# Patient Record
Sex: Male | Born: 1960 | Race: White | Hispanic: No | Marital: Married | Smoking: Current every day smoker
Health system: Southern US, Community
[De-identification: ages and names within clinical notes are randomized; demographics above are authoritative.]

## PROBLEM LIST (undated history)

## (undated) DIAGNOSIS — K649 Unspecified hemorrhoids: Secondary | ICD-10-CM

## (undated) HISTORY — DX: Unspecified hemorrhoids: K64.9

---

## 1999-04-14 ENCOUNTER — Ambulatory Visit (HOSPITAL_COMMUNITY): Admission: RE | Admit: 1999-04-14 | Discharge: 1999-04-14 | Payer: Self-pay

## 2001-12-09 ENCOUNTER — Ambulatory Visit (HOSPITAL_COMMUNITY): Admission: RE | Admit: 2001-12-09 | Discharge: 2001-12-09 | Payer: Self-pay | Admitting: General Surgery

## 2002-06-30 ENCOUNTER — Encounter: Payer: Self-pay | Admitting: General Surgery

## 2002-06-30 ENCOUNTER — Ambulatory Visit (HOSPITAL_COMMUNITY): Admission: RE | Admit: 2002-06-30 | Discharge: 2002-06-30 | Payer: Self-pay | Admitting: General Surgery

## 2002-07-06 ENCOUNTER — Encounter: Payer: Self-pay | Admitting: General Surgery

## 2002-07-06 ENCOUNTER — Ambulatory Visit (HOSPITAL_COMMUNITY): Admission: RE | Admit: 2002-07-06 | Discharge: 2002-07-06 | Payer: Self-pay | Admitting: General Surgery

## 2003-02-15 ENCOUNTER — Ambulatory Visit (HOSPITAL_COMMUNITY): Admission: RE | Admit: 2003-02-15 | Discharge: 2003-02-15 | Payer: Self-pay | Admitting: General Surgery

## 2004-12-21 HISTORY — PX: OTHER SURGICAL HISTORY: SHX169

## 2013-07-10 ENCOUNTER — Encounter: Payer: Self-pay | Admitting: Nurse Practitioner

## 2013-07-10 ENCOUNTER — Ambulatory Visit (INDEPENDENT_AMBULATORY_CARE_PROVIDER_SITE_OTHER): Payer: PRIVATE HEALTH INSURANCE | Admitting: Nurse Practitioner

## 2013-07-10 VITALS — BP 154/94 | Wt 212.0 lb

## 2013-07-10 DIAGNOSIS — M5430 Sciatica, unspecified side: Secondary | ICD-10-CM

## 2013-07-10 DIAGNOSIS — M5432 Sciatica, left side: Secondary | ICD-10-CM

## 2013-07-10 MED ORDER — NAPROXEN 500 MG PO TABS: 500.0000 mg | ORAL_TABLET | Freq: Two times a day (BID) | ORAL | Status: DC

## 2013-07-10 MED ORDER — HYDROCODONE-ACETAMINOPHEN 5-325 MG PO TABS: 1.0000 | ORAL_TABLET | ORAL | Status: DC | PRN

## 2013-07-10 MED ORDER — AMITRIPTYLINE HCL 10 MG PO TABS: 10.0000 mg | ORAL_TABLET | Freq: Every day | ORAL | Status: DC

## 2013-07-11 DIAGNOSIS — M5432 Sciatica, left side: Secondary | ICD-10-CM | POA: Insufficient documentation

## 2013-07-11 NOTE — Assessment & Plan Note (Signed)
Plan: Meds ordered this encounter  Medications  . HYDROcodone-acetaminophen (NORCO/VICODIN) 5-325 MG per tablet    Sig: Take 1 tablet by mouth every 4 (four) hours as needed for pain.    Dispense:  30 tablet    Refill:  0    Order Specific Question:  Supervising Provider    Answer:  Merlyn Albert [[id number]]  . naproxen (NAPROSYN) 500 MG tablet    Sig: Take 1 tablet (500 mg total) by mouth 2 (two) times daily with a meal. Prn pain    Dispense:  30 tablet    Refill:  0    Order Specific Question:  Supervising Provider    Answer:  Merlyn Albert [[id number]]  . amitriptyline (ELAVIL) 10 MG tablet    Sig: Take 1 tablet (10 mg total) by mouth at bedtime. For nerve pain    Dispense:  30 tablet    Refill:  2    Order Specific Question:  Supervising Provider    Answer:  Merlyn Albert [[id number]]   Hydrocodone for severe pain, use sparingly, drowsiness precautions. Because pain is are gone for 3 weeks, recommend a trial of Elavil 10 mg each bedtime to see if this will also help with pain. Call back in 2-3 weeks if no improvement, sooner if worse. Continue other measures.

## 2013-07-11 NOTE — Progress Notes (Signed)
Subjective:  Presents for complaints of left-sided sciatica. Has been seeing Dr. Magnus Sinning in [place] which has helped. He suggested seeing his primary care provider for anti-inflammatories and other medications to help with the pain. Began about 3 weeks ago. No specific history of injury. 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 Pain does not seem to bother him as much when he's walking. Pain localized to the low back left buttock area. It first radiate down the back of his leg to the knee but this has resolved. Using a TENS unit and ice/heat applications.  Objective:   BP 154/94  Wt 212 lb (96.163 kg) NAD. Alert, oriented. Lungs clear. Heart regular rate rhythm. Minimal tenderness in the left buttock area near the sciatic notch. SLR negative on the right, positive on the left. Reflexes normal limit lower extremities. Gait normal limit.  Assessment:Sciatica of left side  Plan: Meds ordered this encounter  Medications  . HYDROcodone-acetaminophen (NORCO/VICODIN) 5-325 MG per tablet    Sig: Take 1 tablet by mouth every 4 (four) hours as needed for pain.    Dispense:  30 tablet    Refill:  0    Order Specific Question:  Supervising Provider    Answer:  Merlyn Albert [[id number]]  . naproxen (NAPROSYN) 500 MG tablet    Sig: Take 1 tablet (500 mg total) by mouth 2 (two) times daily with a meal. Prn pain    Dispense:  30 tablet    Refill:  0    Order Specific Question:  Supervising Provider    Answer:  Merlyn Albert [[id number]]  . amitriptyline (ELAVIL) 10 MG tablet    Sig: Take 1 tablet (10 mg total) by mouth at bedtime. For nerve pain    Dispense:  30 tablet    Refill:  2    Order Specific Question:  Supervising Provider    Answer:  Merlyn Albert [[id number]]   Hydrocodone for severe pain, use sparingly, drowsiness precautions. Because pain is are gone for 3 weeks, recommend a trial of Elavil 10 mg each bedtime to see if this will also help with pain. Call back  in 2-3 weeks if no improvement, sooner if worse. Continue other measures.

## 2013-07-17 ENCOUNTER — Telehealth: Payer: Self-pay | Admitting: Family Medicine

## 2013-07-17 NOTE — Telephone Encounter (Signed)
This was a late message on Monday; since I am off Tuesday, please call patient to see if problem is urgent.  Thanks

## 2013-07-17 NOTE — Telephone Encounter (Signed)
Patient would like for you to call him regarding his past visit

## 2013-07-18 NOTE — Telephone Encounter (Signed)
Left message with male to have patient return call  

## 2013-07-18 NOTE — Telephone Encounter (Signed)
Patient wants you to call him tomm about his meds.

## 2013-07-21 ENCOUNTER — Telehealth: Payer: Self-pay | Admitting: *Deleted

## 2013-07-21 MED ORDER — ETODOLAC 300 MG PO CAPS: 300.0000 mg | ORAL_CAPSULE | Freq: Three times a day (TID) | ORAL | Status: DC

## 2013-07-21 NOTE — Addendum Note (Signed)
Addended by: Margaretha Sheffield on: 07/21/2013 03:59 PM   Modules accepted: Orders

## 2013-07-21 NOTE — Telephone Encounter (Signed)
Saw carolyn last week. Prescribed pain med for sciatica pain but wanted to know if he could also get an anti inflammatory med. [facility] [place]

## 2013-07-21 NOTE — Telephone Encounter (Signed)
Rx sent electronically to [facility]. Patient notified and advised to stop the naproxen.

## 2013-07-21 NOTE — Telephone Encounter (Signed)
Patient may use the etodolac 300 mg 1 3 times a day #60. One refill. Followup if ongoing troubles. Do not take other anti-inflammatories with this such as Naprosyn/Advil/Aleve

## 2013-07-22 NOTE — Telephone Encounter (Signed)
Patient left another message Friday and Dr. Lorin Picket changed his anti inflammatory med .

## 2013-07-27 ENCOUNTER — Telehealth: Payer: Self-pay | Admitting: Family Medicine

## 2013-07-27 ENCOUNTER — Other Ambulatory Visit: Payer: Self-pay | Admitting: *Deleted

## 2013-07-27 MED ORDER — PREDNISONE 20 MG PO TABS: ORAL_TABLET | ORAL | Status: DC

## 2013-07-27 NOTE — Telephone Encounter (Signed)
Patient called today asking if Dr Magnus Sinning had sent over the paperwork asking you to put patient on prednisone?     [facility]

## 2013-07-27 NOTE — Telephone Encounter (Signed)
So far haven't heard, but prednisone can be helpful for this,it is a short term med, if ongoing trouble follow up. Prednisone 20 mg , 3qd for 3d then 2qd for 3d then 1qd for 3d, #18. Minimize sugar and starches while on medicine!!

## 2013-07-27 NOTE — Telephone Encounter (Signed)
Discussed with pt. Med sent to [facility] [place].

## 2013-08-02 ENCOUNTER — Telehealth: Payer: Self-pay | Admitting: Family Medicine

## 2013-08-02 MED ORDER — PREDNISONE 20 MG PO TABS: ORAL_TABLET | ORAL | Status: DC

## 2013-08-02 NOTE — Telephone Encounter (Signed)
Patient would like a refill on predniSONE (DELTASONE) 20 MG tablet.  States this really helps his back feel much better.    [facility] in [place]  Please call Patient. Thanks

## 2013-08-02 NOTE — Telephone Encounter (Signed)
Ok may ref Pred 20 numb 18 3 qd for 3 d two qd for 3 d one qd for2d

## 2013-08-02 NOTE — Telephone Encounter (Signed)
Sent in Pred 20 numb 18 3 qd for 3 d two qd for 3 d one qd for 2d to [facility]. Left message on voicemail notifying patient.

## 2013-10-17 ENCOUNTER — Emergency Department (HOSPITAL_COMMUNITY): Admission: EM | Admit: 2013-10-17 | Discharge: 2013-10-17 | Discharge status: Against Medical Advice | Payer: Self-pay

## 2013-10-27 ENCOUNTER — Encounter (HOSPITAL_COMMUNITY): Payer: Self-pay

## 2013-10-27 ENCOUNTER — Encounter (HOSPITAL_COMMUNITY)
Admission: RE | Admit: 2013-10-27 | Discharge: 2013-10-27 | Disposition: A | Discharge status: Home / Self Care | Payer: PRIVATE HEALTH INSURANCE | Source: Ambulatory Visit | Attending: Family Medicine | Admitting: Family Medicine

## 2013-10-27 DIAGNOSIS — Z23 Encounter for immunization: Secondary | ICD-10-CM | POA: Insufficient documentation

## 2013-10-27 MED ORDER — RABIES IMMUNE GLOBULIN 150 UNIT/ML IM INJ
20.0000 [IU]/kg | INJECTION | Freq: Once | INTRAMUSCULAR | Status: AC
  Administered 2013-10-27: 2000 [IU] via INTRAMUSCULAR

## 2013-10-27 NOTE — Progress Notes (Signed)
Patient tolerated well. Instructed to watch sites for swelling redness or drainage.

## 2016-04-22 ENCOUNTER — Ambulatory Visit (INDEPENDENT_AMBULATORY_CARE_PROVIDER_SITE_OTHER): Payer: PRIVATE HEALTH INSURANCE | Admitting: Nurse Practitioner

## 2016-04-22 ENCOUNTER — Encounter: Payer: Self-pay | Admitting: Nurse Practitioner

## 2016-04-22 VITALS — BP 122/88 | Temp 99.0°F | Ht 68.0 in | Wt 203.0 lb

## 2016-04-22 DIAGNOSIS — J329 Chronic sinusitis, unspecified: Secondary | ICD-10-CM | POA: Diagnosis not present

## 2016-04-22 DIAGNOSIS — J310 Chronic rhinitis: Secondary | ICD-10-CM

## 2016-04-22 MED ORDER — VARENICLINE TARTRATE 0.5 MG X 11 & 1 MG X 42 PO MISC: ORAL | Status: DC

## 2016-04-22 MED ORDER — AZITHROMYCIN 250 MG PO TABS: ORAL_TABLET | ORAL | Status: DC

## 2016-04-22 MED ORDER — VARENICLINE TARTRATE 1 MG PO TABS: 1.0000 mg | ORAL_TABLET | Freq: Two times a day (BID) | ORAL | Status: DC

## 2016-04-22 MED ORDER — HYDROCODONE-HOMATROPINE 5-1.5 MG/5ML PO SYRP: 5.0000 mL | ORAL_SOLUTION | ORAL | Status: DC | PRN

## 2016-04-24 ENCOUNTER — Encounter: Payer: Self-pay | Admitting: Nurse Practitioner

## 2016-04-24 NOTE — Progress Notes (Signed)
Subjective:  Presents for complaints of sinus symptoms and congestion for the past 3 weeks. Fever has resolved. Sore throat at nighttime. Cough in the morning producing green mucus. Frequent cough at nighttime keeping him awake. No wheezing. No ear pain or headache. Would like to restart Chantix for smoking cessation, has taken this without difficulty in the past.  Objective:   BP 122/88 mmHg  Temp(Src) 99 F (37.2 C) (Oral)  Ht 5\' 8"  (1.727 m)  Wt 203 lb (92.08 kg)  BMI 30.87 kg/m2 NAD. Alert, oriented. TMs retracted, no erythema. Pharynx mildly erythematous with PND noted. Neck supple with mild soft anterior adenopathy. Lungs occasional faint expiratory crackles posterior only. No wheezing or tachypnea. Heart regular rate rhythm.  Assessment: Rhinosinusitis  Plan:  Meds ordered this encounter  Medications  . azithromycin (ZITHROMAX Z-PAK) 250 MG tablet    Sig: Take 2 tablets (500 mg) on  Day 1,  followed by 1 tablet (250 mg) once daily on Days 2 through 5.    Dispense:  6 each    Refill:  0    Order Specific Question:  Supervising Provider    Answer:  Mikey Kirschner [[id number]]  . HYDROcodone-homatropine (HYCODAN) 5-1.5 MG/5ML syrup    Sig: Take 5 mLs by mouth every 4 (four) hours as needed.    Dispense:  120 mL    Refill:  0    Order Specific Question:  Supervising Provider    Answer:  Mikey Kirschner [[id number]]  . varenicline (CHANTIX STARTING MONTH PAK) 0.5 MG X 11 & 1 MG X 42 tablet    Sig: Take one 0.5 mg tablet by mouth once daily for 3 days, then increase to one 0.5 mg tablet twice daily for 4 days, then increase to one 1 mg tablet twice daily.    Dispense:  53 tablet    Refill:  0    Order Specific Question:  Supervising Provider    Answer:  Mikey Kirschner [[id number]]  . varenicline (CHANTIX CONTINUING MONTH PAK) 1 MG tablet    Sig: Take 1 tablet (1 mg total) by mouth 2 (two) times daily.    Dispense:  60 tablet    Refill:  1    Order Specific Question:  Supervising Provider     Answer:  Mikey Kirschner [[id number]]   Call back if symptoms worsen or persist. OTC meds for daytime use.

## 2016-04-28 ENCOUNTER — Other Ambulatory Visit: Payer: Self-pay | Admitting: Nurse Practitioner

## 2016-04-28 MED ORDER — LEVOFLOXACIN 500 MG PO TABS: 500.0000 mg | ORAL_TABLET | Freq: Every day | ORAL | Status: DC

## 2016-09-30 ENCOUNTER — Encounter: Payer: PRIVATE HEALTH INSURANCE | Admitting: Family Medicine

## 2016-10-21 ENCOUNTER — Ambulatory Visit (INDEPENDENT_AMBULATORY_CARE_PROVIDER_SITE_OTHER): Payer: PRIVATE HEALTH INSURANCE | Admitting: Family Medicine

## 2016-10-21 ENCOUNTER — Encounter: Payer: Self-pay | Admitting: Family Medicine

## 2016-10-21 VITALS — BP 122/78 | Ht 68.0 in | Wt 209.2 lb

## 2016-10-21 DIAGNOSIS — Z0000 Encounter for general adult medical examination without abnormal findings: Secondary | ICD-10-CM | POA: Diagnosis not present

## 2016-10-21 NOTE — Progress Notes (Signed)
   Subjective:    Patient ID: Bruce Hahn, male    DOB: 1961/02/04, 55 y.o.   MRN: [id number]  HPI The patient comes in today for a wellness visit.    A review of their health history was completed.  A review of medications was also completed.  Any needed refills; chantix if needed  Eating habits: trying to eat healthy  Falls/  MVA accidents in past few months: none  Regular exercise: yes -very active  Specialist pt sees on regular basis: none  Preventative health issues were discussed.   Additional concerns: none    Review of Systems  Constitutional: Negative for activity change, appetite change and fever.  HENT: Negative for congestion and rhinorrhea.   Eyes: Negative for discharge.  Respiratory: Negative for cough and wheezing.   Cardiovascular: Negative for chest pain.  Gastrointestinal: Negative for abdominal pain, blood in stool and vomiting.  Genitourinary: Negative for difficulty urinating and frequency.  Musculoskeletal: Negative for neck pain.  Skin: Negative for rash.  Allergic/Immunologic: Negative for environmental allergies and food allergies.  Neurological: Negative for weakness and headaches.  Psychiatric/Behavioral: Negative for agitation.       Objective:   Physical Exam  Constitutional: He appears well-developed and well-nourished.  HENT:  Head: Normocephalic and atraumatic.  Right Ear: External ear normal.  Left Ear: External ear normal.  Nose: Nose normal.  Mouth/Throat: Oropharynx is clear and moist.  Eyes: EOM are normal. Pupils are equal, round, and reactive to light.  Neck: Normal range of motion. Neck supple. No thyromegaly present.  Cardiovascular: Normal rate, regular rhythm and normal heart sounds.   No murmur heard. Pulmonary/Chest: Effort normal and breath sounds normal. No respiratory distress. He has no wheezes.  Abdominal: Soft. Bowel sounds are normal. He exhibits no distension and no mass. There is no tenderness.    Genitourinary: Penis normal.  Musculoskeletal: Normal range of motion. He exhibits no edema.  Lymphadenopathy:    He has no cervical adenopathy.  Neurological: He is alert. He exhibits normal muscle tone.  Skin: Skin is warm and dry. No erythema.  Psychiatric: He has a normal mood and affect. His behavior is normal. Judgment normal.          Assessment & Plan:  The patient does smoke we'll send him some information regarding quitting smoking I did discuss Wellbutrin and Chantix he will think about it  Adult wellness-complete.wellness physical was conducted today. Importance of diet and exercise were discussed in detail. In addition to this a discussion regarding safety was also covered. We also reviewed over immunizations and gave recommendations regarding current immunization needed for age. In addition to this additional areas were also touched on including: Preventative health exams needed: Colonoscopy referral for colonoscopy with [facility]  Patient was advised yearly wellness exam  Patient was instructed to send [place] his lab readings he states he had recent lab work through [facility] does not one to do additional lab work

## 2016-10-22 ENCOUNTER — Encounter: Payer: Self-pay | Admitting: Family Medicine

## 2016-10-22 ENCOUNTER — Other Ambulatory Visit: Payer: Self-pay | Admitting: *Deleted

## 2016-10-22 DIAGNOSIS — Z1211 Encounter for screening for malignant neoplasm of colon: Secondary | ICD-10-CM

## 2016-10-22 NOTE — Progress Notes (Signed)
Gi referral put in

## 2016-11-24 ENCOUNTER — Encounter (INDEPENDENT_AMBULATORY_CARE_PROVIDER_SITE_OTHER): Payer: Self-pay | Admitting: *Deleted

## 2016-12-11 ENCOUNTER — Ambulatory Visit (INDEPENDENT_AMBULATORY_CARE_PROVIDER_SITE_OTHER): Payer: PRIVATE HEALTH INSURANCE | Admitting: Family Medicine

## 2016-12-11 ENCOUNTER — Encounter: Payer: Self-pay | Admitting: Family Medicine

## 2016-12-11 VITALS — BP 118/84 | Temp 99.1°F | Ht 68.0 in | Wt 206.0 lb

## 2016-12-11 DIAGNOSIS — L02219 Cutaneous abscess of trunk, unspecified: Secondary | ICD-10-CM

## 2016-12-11 DIAGNOSIS — L03319 Cellulitis of trunk, unspecified: Secondary | ICD-10-CM

## 2016-12-11 MED ORDER — HYDROCODONE-ACETAMINOPHEN 5-325 MG PO TABS: 1.0000 | ORAL_TABLET | Freq: Four times a day (QID) | ORAL | 0 refills | Status: DC | PRN

## 2016-12-11 MED ORDER — DOXYCYCLINE HYCLATE 100 MG PO TABS: 100.0000 mg | ORAL_TABLET | Freq: Two times a day (BID) | ORAL | 0 refills | Status: DC

## 2016-12-11 NOTE — Progress Notes (Signed)
   Subjective:    Patient ID: Bruce Hahn, male    DOB: 03-Oct-1961, 55 y.o.   MRN: [id number]  Abscess  This is a new problem. Episode onset: 3 days. Treatments tried: castor oil, neosporin, surgery 8 years ago.   Hx of absc3ss with chronic fistula  Developed lump and tender and feltTender and sore  History of complicated abscess in the past. Had an operation. Via Dr. Tamala Julian. 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 Patient states that it drained for nearly 7 years.  Now arrives with soreness and pain in the same area. No obvious discharge. Distinctly tender. Patient worried getting infected again   Review of Systems No headache, no major weight loss or weight gain, no chest pain no back pain abdominal pain no change in bowel habits complete ROS otherwise negative     Objective:   Physical Exam  Alert vitals stable, NAD. Blood pressure good on repeat. HEENT normal. Lungs clear. Heart regular rate and rhythm. 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 Pain medicine prescribed local measures discussed. If persists may need surgery referral, not approachable from primary care perspective

## 2017-01-04 ENCOUNTER — Other Ambulatory Visit: Payer: Self-pay | Admitting: Nurse Practitioner

## 2017-02-25 ENCOUNTER — Telehealth: Payer: Self-pay | Admitting: Family Medicine

## 2017-02-25 MED ORDER — DOXYCYCLINE HYCLATE 100 MG PO TABS: 100.0000 mg | ORAL_TABLET | Freq: Two times a day (BID) | ORAL | 0 refills | Status: DC

## 2017-02-25 NOTE — Telephone Encounter (Signed)
Patient says he say was seen for anal fissure by Dr. Richardson Landry in December 2017.  He was prescribed doxycycline.  He is wanting to know if he can refill the doxycycline for him because it is hurting him again.   [facility]

## 2017-02-25 NOTE — Telephone Encounter (Signed)
Nurse's-please talk with patient document what is going on. If patient is having high fever or anything worrisome please let me know. Patient may have a refill of medication. Doxycycline 100 mg 1 twice a day for the next 10 days, patient will need to follow-up if ongoing troubles, warm soaks 10-15 minutes at a time every couple hours would be advisable if fevers severe pain or swelling he needs to be seen here or ER

## 2017-02-25 NOTE — Telephone Encounter (Signed)
Spoke with patient and patient denied any fever, or worrisome symptoms. Informed patient per Dr.Scott Luking- we can send in Doxycycline 100 mg take 1 tablet by mouth twice a day for the next 10 days. Also recommend warm soaks 10-15 minutes at a time every few hours. If any ongoing troubles or fevers, or severe pain you will need to be seen here or at the ER. Patient verbalized understanding.

## 2017-05-04 ENCOUNTER — Ambulatory Visit (INDEPENDENT_AMBULATORY_CARE_PROVIDER_SITE_OTHER): Payer: PRIVATE HEALTH INSURANCE | Admitting: Family Medicine

## 2017-05-04 ENCOUNTER — Encounter: Payer: Self-pay | Admitting: Family Medicine

## 2017-05-04 VITALS — BP 128/80 | Ht 68.0 in | Wt 209.0 lb

## 2017-05-04 DIAGNOSIS — M1712 Unilateral primary osteoarthritis, left knee: Secondary | ICD-10-CM

## 2017-05-04 MED ORDER — METHYLPREDNISOLONE ACETATE 40 MG/ML IJ SUSP: 40.0000 mg | Freq: Once | INTRAMUSCULAR | Status: DC

## 2017-05-04 NOTE — Progress Notes (Signed)
   Subjective:    Patient ID: Bruce Hahn, male    DOB: 11/01/1961, 56 y.o.   MRN: [id number]  HPIleft knee pain. Started over 2 years ago. Has been seeing [facility].Has been told eventually will need knee replacement. Has not had an injection for a number of months. Notes increasing pain and swelling with his job Wants to get injection.     Review of Systems No headache, no major weight loss or weight gain, no chest pain no back pain abdominal pain no change in bowel habits complete ROS otherwise negative     Objective:   Physical Exam  Alert vitals stable, NAD. Blood pressure good on repeat. HEENT normal. Lungs clear. Heart regular rate and rhythm. Left knee crepitations effusion noted patient prepped draped anesthetized and injected 1 mL Depo-Medrol 2 mL plain Xylocaine      Assessment & Plan:  Impression the injection plan local measures discussed

## 2017-06-11 ENCOUNTER — Telehealth: Payer: Self-pay | Admitting: Family Medicine

## 2017-06-11 DIAGNOSIS — Z1211 Encounter for screening for malignant neoplasm of colon: Secondary | ICD-10-CM

## 2017-06-11 NOTE — Telephone Encounter (Signed)
Patient is inquiring about how he needs to get started on getting his colonoscopy set up.  Please advise.

## 2017-06-12 NOTE — Telephone Encounter (Signed)
We can go ahead and refer him for screening colonoscopy. Find out from the patient which gastroenterology group he would like to use then go ahead and put in referral please

## 2017-06-14 NOTE — Telephone Encounter (Signed)
Left message return call 06/14/17

## 2017-06-21 NOTE — Telephone Encounter (Signed)
Left message to return call 

## 2017-06-28 NOTE — Telephone Encounter (Signed)
Left message to return call 

## 2017-06-29 NOTE — Telephone Encounter (Signed)
Left message return call 06/29/17 (referral for [facility] ordered in epic)

## 2017-06-29 NOTE — Addendum Note (Signed)
Addended by: Ofilia Neas R on: 06/29/2017 11:22 AM   Modules accepted: Orders

## 2017-06-29 NOTE — Telephone Encounter (Signed)
Bruce Hahn

## 2017-06-29 NOTE — Telephone Encounter (Signed)
Pt states he wants to see who ever you recommend. Who you would see if it was you. Please advise

## 2017-07-06 ENCOUNTER — Encounter: Payer: Self-pay | Admitting: Family Medicine

## 2017-08-05 ENCOUNTER — Telehealth: Payer: Self-pay

## 2017-08-05 NOTE — Telephone Encounter (Signed)
PT has had problems with hemorrhoids and has an anal fissure. OV with Walden Field, NP on 10/01/2017 at 10:30 Am prior to scheduling colonoscopy.

## 2017-08-05 NOTE — Telephone Encounter (Signed)
[phone] or wife [phone] patient received letter to schedule tcs

## 2017-10-01 ENCOUNTER — Ambulatory Visit: Payer: PRIVATE HEALTH INSURANCE | Admitting: Nurse Practitioner

## 2017-11-10 ENCOUNTER — Encounter: Payer: Self-pay | Admitting: Nurse Practitioner

## 2017-11-10 ENCOUNTER — Encounter: Payer: Self-pay | Admitting: *Deleted

## 2017-11-10 ENCOUNTER — Other Ambulatory Visit: Payer: Self-pay | Admitting: *Deleted

## 2017-11-10 ENCOUNTER — Ambulatory Visit (INDEPENDENT_AMBULATORY_CARE_PROVIDER_SITE_OTHER): Payer: PRIVATE HEALTH INSURANCE | Admitting: Nurse Practitioner

## 2017-11-10 VITALS — BP 145/97 | HR 84 | Temp 97.5°F | Ht 67.0 in | Wt 209.8 lb

## 2017-11-10 DIAGNOSIS — K649 Unspecified hemorrhoids: Secondary | ICD-10-CM | POA: Diagnosis not present

## 2017-11-10 DIAGNOSIS — R234 Changes in skin texture: Secondary | ICD-10-CM | POA: Diagnosis not present

## 2017-11-10 DIAGNOSIS — Z1211 Encounter for screening for malignant neoplasm of colon: Secondary | ICD-10-CM

## 2017-11-10 MED ORDER — PEG 3350-KCL-NA BICARB-NACL 420 G PO SOLR: 4000.0000 mL | Freq: Once | ORAL | 0 refills | Status: AC

## 2017-11-10 NOTE — Assessment & Plan Note (Signed)
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  He was offered further surgical intervention but declined.  It eventually healed for about a couple years but is not returned.  Occasional leaking.  It is located on the soft tissue of his buttock.  Commended follow-up with primary care, consider wound care consult versus surgical intervention.

## 2017-11-10 NOTE — Assessment & Plan Note (Signed)
Noted hemorrhoids that are not significantly bothersome.  Declines treatment at this time.  He does not even use over-the-counter treatment.  He only has hemorrhoid flare when he is dehydrated during the summer which causes mildly hardened stools.  This is improved with fish oil and adequate water intake.  Continue to monitor, follow-up as needed for any hemorrhoid treatment desired.  He is due for a colonoscopy.  He states he had a colonoscopy about 20 years ago which was "normal."  However, we were unable to obtain records from any pain likely due to the age of the exam.  We will proceed with colonoscopy at this time.  Proceed with colonoscopy with Dr. Oneida Alar in the near future. The risks, benefits, and alternatives have been discussed in detail with the patient. They state understanding and desire to proceed.   She is not on any medications.  Denies alcohol and drug use.  Conscious sedation should be adequate for his procedure.

## 2017-11-10 NOTE — Progress Notes (Signed)
cc'ed to pcp °

## 2017-11-10 NOTE — Patient Instructions (Signed)
1. We will schedule your procedure for you. 2. Call us if you have any questions or concerns. 3. Follow-up as needed for any GI issues.    Happy Thanksgiving!!!

## 2017-11-10 NOTE — Progress Notes (Addendum)
REVIEWED-NO ADDITIONAL RECOMMENDATIONS.  Primary Care Physician:  Kathyrn Drown, MD Primary Gastroenterologist:  Dr. Oneida Alar  Chief Complaint  Patient presents with  . Hemorrhoids  . Anal Fissure  . Colonoscopy    consult, last tcs approx 20 yrs ago    HPI:   Bruce Hahn is a 56 y.o. male who presents on referral from primary care for colonoscopy.  Last colonoscopy approximately 20 years ago.  The records were not received because no colonoscopy record was found.  He was brought in for an office visit because of hemorrhoids and fissure.  Today he states he's doing well overall. Had a colonoscopy about 20 years ago and was told they found "nothing." Denies abdominal pain, N/V, hematochezia, melena, fever, chills, unintentional weight loss, acute bowel habit changes. He does have mild hemorrhoids, rarely bother him unless he's dehydrated. Doesn't feel the need for treatment. Has an buttocks fissure that started as a boil and was operated on that didn't go well. Has wound leakage intermittently. It healed for a couple years but then returned. States it is on his buttocks. Denies chest pain, dyspnea, dizziness, lightheadedness, syncope, near syncope. Denies any other upper or lower GI symptoms.  Past Medical History:  Diagnosis Date  . Hemorrhoids     Past Surgical History:  Procedure Laterality Date  . boil excision  2006    No current outpatient medications on file.   Current Facility-Administered Medications  Medication Dose Route Frequency Provider Last Rate Last Dose  . methylPREDNISolone acetate (DEPO-MEDROL) injection 40 mg  40 mg Intra-articular Once Mikey Kirschner, MD        Allergies as of 11/10/2017 - Review Complete 11/10/2017  Allergen Reaction Noted  . Penicillins  07/10/2013    Family History  Problem Relation Age of Onset  . Colon cancer Neg Hx     Social History   Socioeconomic History  . Marital status: Married    Spouse name: Not on file  .  Number of children: Not on file  . Years of education: Not on file  . Highest education level: Not on file  Social Needs  . Financial resource strain: Not on file  . Food insecurity - worry: Not on file  . Food insecurity - inability: Not on file  . Transportation needs - medical: Not on file  . Transportation needs - non-medical: Not on file  Occupational History  . Not on file  Tobacco Use  . Smoking status: Current Every Day Smoker    Packs/day: 0.50    Types: Cigarettes  . Smokeless tobacco: Never Used  Substance and Sexual Activity  . Alcohol use: No  . Drug use: No  . Sexual activity: Not on file  Other Topics Concern  . Not on file  Social History Narrative  . Not on file    Review of Systems: Complete ROS negative except as per HPI.    Physical Exam: BP (!) 145/97   Pulse 84   Temp (!) 97.5 F (36.4 C) (Oral)   Ht 5\' 7"  (1.702 m)   Wt 209 lb 12.8 oz (95.2 kg)   BMI 32.86 kg/m  General:   Alert and oriented. Pleasant and cooperative. Well-nourished and well-developed.  Eyes:  Without icterus, sclera clear and conjunctiva pink.  Ears:  Normal auditory acuity. Cardiovascular:  S1, S2 present without murmurs appreciated. Extremities without clubbing or edema. Respiratory:  Clear to auscultation bilaterally. No wheezes, rales, or rhonchi. No distress.  Gastrointestinal:  +BS,  soft, non-tender and non-distended. No HSM noted. No guarding or rebound. No masses appreciated.  Rectal:  Deferred  Musculoskalatal:  Symmetrical without gross deformities. Neurologic:  Alert and oriented x4;  grossly normal neurologically. Psych:  Alert and cooperative. Normal mood and affect. Heme/Lymph/Immune: No excessive bruising noted.    11/10/2017 9:26 AM   Disclaimer: This note was dictated with voice recognition software. Similar sounding words can inadvertently be transcribed and may not be corrected upon review.

## 2017-11-29 ENCOUNTER — Encounter: Payer: PRIVATE HEALTH INSURANCE | Admitting: Family Medicine

## 2017-12-06 ENCOUNTER — Encounter (HOSPITAL_COMMUNITY): Payer: Self-pay | Admitting: *Deleted

## 2017-12-06 ENCOUNTER — Other Ambulatory Visit: Payer: Self-pay

## 2017-12-06 ENCOUNTER — Ambulatory Visit (HOSPITAL_COMMUNITY)
Admission: RE | Admit: 2017-12-06 | Discharge: 2017-12-06 | Disposition: A | Discharge status: Home / Self Care | Payer: PRIVATE HEALTH INSURANCE | Source: Ambulatory Visit | Attending: Gastroenterology | Admitting: Gastroenterology

## 2017-12-06 ENCOUNTER — Encounter (HOSPITAL_COMMUNITY)
Admission: RE | Disposition: A | Discharge status: Home / Self Care | Payer: Self-pay | Source: Ambulatory Visit | Attending: Gastroenterology

## 2017-12-06 DIAGNOSIS — K621 Rectal polyp: Secondary | ICD-10-CM | POA: Insufficient documentation

## 2017-12-06 DIAGNOSIS — K602 Anal fissure, unspecified: Secondary | ICD-10-CM | POA: Insufficient documentation

## 2017-12-06 DIAGNOSIS — Z880 Allergy status to penicillin: Secondary | ICD-10-CM | POA: Diagnosis not present

## 2017-12-06 DIAGNOSIS — K6389 Other specified diseases of intestine: Secondary | ICD-10-CM

## 2017-12-06 DIAGNOSIS — F17210 Nicotine dependence, cigarettes, uncomplicated: Secondary | ICD-10-CM | POA: Diagnosis not present

## 2017-12-06 DIAGNOSIS — D122 Benign neoplasm of ascending colon: Secondary | ICD-10-CM

## 2017-12-06 DIAGNOSIS — Z1212 Encounter for screening for malignant neoplasm of rectum: Secondary | ICD-10-CM

## 2017-12-06 DIAGNOSIS — K635 Polyp of colon: Secondary | ICD-10-CM | POA: Diagnosis not present

## 2017-12-06 DIAGNOSIS — D123 Benign neoplasm of transverse colon: Secondary | ICD-10-CM | POA: Diagnosis not present

## 2017-12-06 DIAGNOSIS — K5730 Diverticulosis of large intestine without perforation or abscess without bleeding: Secondary | ICD-10-CM | POA: Insufficient documentation

## 2017-12-06 DIAGNOSIS — K648 Other hemorrhoids: Secondary | ICD-10-CM | POA: Insufficient documentation

## 2017-12-06 DIAGNOSIS — Z1211 Encounter for screening for malignant neoplasm of colon: Secondary | ICD-10-CM | POA: Diagnosis not present

## 2017-12-06 HISTORY — PX: BIOPSY: SHX5522

## 2017-12-06 HISTORY — PX: COLONOSCOPY: SHX5424

## 2017-12-06 HISTORY — PX: POLYPECTOMY: SHX5525

## 2017-12-06 SURGERY — COLONOSCOPY
Anesthesia: Moderate Sedation

## 2017-12-06 MED ORDER — MEPERIDINE HCL 100 MG/ML IJ SOLN
INTRAMUSCULAR | Status: AC
  Filled 2017-12-06: qty 2

## 2017-12-06 MED ORDER — MEPERIDINE HCL 100 MG/ML IJ SOLN
INTRAMUSCULAR | Status: DC | PRN
  Administered 2017-12-06: 50 mg via INTRAVENOUS
  Administered 2017-12-06 (×2): 25 mg via INTRAVENOUS

## 2017-12-06 MED ORDER — SODIUM CHLORIDE 0.9 % IV SOLN
INTRAVENOUS | Status: DC
  Administered 2017-12-06: 13:00:00 via INTRAVENOUS

## 2017-12-06 MED ORDER — MIDAZOLAM HCL 5 MG/5ML IJ SOLN
INTRAMUSCULAR | Status: DC | PRN
  Administered 2017-12-06: 1 mg via INTRAVENOUS
  Administered 2017-12-06 (×2): 2 mg via INTRAVENOUS

## 2017-12-06 MED ORDER — MIDAZOLAM HCL 5 MG/5ML IJ SOLN
INTRAMUSCULAR | Status: AC
  Filled 2017-12-06: qty 10

## 2017-12-06 MED ORDER — NITROGLYCERIN 0.4 % RE OINT: 1.0000 [drp] | TOPICAL_OINTMENT | Freq: Three times a day (TID) | RECTAL | 1 refills | Status: DC

## 2017-12-06 MED ORDER — STERILE WATER FOR IRRIGATION IR SOLN
Status: DC | PRN
  Administered 2017-12-06: 2 mL

## 2017-12-06 NOTE — Op Note (Signed)
[facility] Patient Name: Bruce Hahn Procedure Date: 12/06/2017 1:48 PM MRN: [id number] Date of Birth: 18-May-1961 Attending MD: Barney Drain MD, MD CSN: [id number] Age: 56 Admit Type: Outpatient Procedure:                Colonoscopy WITH COLD FORCEPS BIOPSY/COLD SNARE                            POLYPECTOMY Indications:              Screening for colorectal malignant neoplasm Providers:                Barney Drain MD, MD, Janeece Riggers, RN, Lurline Del, RN Referring MD:             Elayne Snare. Luking Medicines:                Meperidine 100 mg IV, Midazolam 5 mg IV Complications:            No immediate complications. Estimated Blood Loss:     Estimated blood loss was minimal. Procedure:                Pre-Anesthesia Assessment:                           - Prior to the procedure, a History and Physical                            was performed, and patient medications and                            allergies were reviewed. The patient's tolerance of                            previous anesthesia was also reviewed. The risks                            and benefits of the procedure and the sedation                            options and risks were discussed with the patient.                            All questions were answered, and informed consent                            was obtained. Prior Anticoagulants: The patient has                            taken naproxen, last dose was 4 days prior to                            procedure. ASA Grade Assessment: I - A normal,                            healthy patient. After reviewing the risks and  benefits, the patient was deemed in satisfactory                            condition to undergo the procedure. After obtaining                            informed consent, the colonoscope was passed under                            direct vision. Throughout the procedure, the                            patient's blood  pressure, pulse, and oxygen                            saturations were monitored continuously. The                            EC-[id number] ([id number]) scope was introduced through                            the anus and advanced to the the cecum, identified                            by appendiceal orifice and ileocecal valve. The                            colonoscopy was technically difficult and complex                            due to significant looping. Successful completion                            of the procedure was aided by increasing the dose                            of sedation medication, straightening and                            shortening the scope to obtain bowel loop reduction                            and COLOWRAP. The patient tolerated the procedure                            well. The quality of the bowel preparation was                            good. The ileocecal valve, appendiceal orifice, and                            rectum were photographed. Scope In: Scope Out: 2:32:58 PM Scope Withdrawal Time: 0 hours 25 minutes 42 seconds  Findings:  Two sessile polyps were found in the rectum and proximal ascending       colon. The polyps were 3 to 5 mm in size. These polyps were removed with       a cold snare. Resection and retrieval were complete.      There was a small lipoma, 6 mm in diameter, at the splenic flexure.       Biopsies were taken with a cold forceps for histology.      A single small-mouthed diverticulum was found in the recto-sigmoid colon.      Internal hemorrhoids were found during retroflexion. The hemorrhoids       were moderate.      A small anal fissure was found in the anal canal. Impression:               - Two 3 to 5 mm polyps in the rectum and in the                            proximal ascending colon, removed with a cold                            snare. Resected and retrieved.                           - Small lipoma at the  splenic flexure. Biopsied.                           - Mild diverticulosis in the recto-sigmoid colon.                           - MODERATE Internal hemorrhoids.                           - SMALL Anal fissure. Moderate Sedation:      Moderate (conscious) sedation was administered by the endoscopy nurse       and supervised by the endoscopist. The following parameters were       monitored: oxygen saturation, heart rate, blood pressure, and response       to care. Total physician intraservice time was 44 minutes. Recommendation:           - High fiber diet.                           - Continue present medications. NTG 0.4% TOP TID                            FOR 30 DAYS TO HEAL ANAL FISSURE.                           - Await pathology results.                           - Repeat colonoscopy in 5-10 years for surveillance.                           - Patient has a contact number available for  emergencies. The signs and symptoms of potential                            delayed complications were discussed with the                            patient. Return to normal activities tomorrow.                            Written discharge instructions were provided to the                            patient. Procedure Code(s):        --- Professional ---                           [id number], Colonoscopy, flexible; with removal of                            tumor(s), polyp(s), or other lesion(s) by snare                            technique                           45380, 59, Colonoscopy, flexible; with biopsy,                            single or multiple                           99152, Moderate sedation services provided by the                            same physician or other qualified health care                            professional performing the diagnostic or                            therapeutic service that the sedation supports,                            requiring  the presence of an independent trained                            observer to assist in the monitoring of the                            patient's level of consciousness and physiological                            status; initial 15 minutes of intraservice time,                            patient  age 41 years or older                           [phone], Moderate sedation services; each additional                            15 minutes intraservice time                           [phone], Moderate sedation services; each additional                            15 minutes intraservice time Diagnosis Code(s):        --- Professional ---                           Z12.11, Encounter for screening for malignant                            neoplasm of colon                           K62.1, Rectal polyp                           D12.2, Benign neoplasm of ascending colon                           D17.5, Benign lipomatous neoplasm of                            intra-abdominal organs                           K64.8, Other hemorrhoids                           K60.2, Anal fissure, unspecified                           K57.30, Diverticulosis of large intestine without                            perforation or abscess without bleeding CPT copyright 2016 [employer]. All rights reserved. The codes documented in this report are preliminary and upon coder review may  be revised to meet current compliance requirements. Barney Drain, MD Barney Drain MD, MD 12/06/2017 2:49:52 PM This report has been signed electronically. Number of Addenda: 0

## 2017-12-06 NOTE — Discharge Instructions (Signed)
You had 2 polyps removed. YOU HAVE A SMALL LIPOMA IN YOUR LEFT COLON. You have MODERATE SIZE internal hemorrhoids. I SAW YOUR ANAL FISSURE.   DRINK WATER TO KEEP YOUR URINE LIGHT YELLOW.  FOLLOW A HIGH FIBER DIET. AVOID ITEMS THAT CAUSE BLOATING & GAS. SEE INFO BELOW.  CONTINUE YOUR WEIGHT LOSS EFFORTS.  WHILE I DO NOT WANT TO ALARM YOU, YOUR BODY MASS INDEX(BMI) IS OVER 30 WHICH MEANS YOU ARE OBESE. OBESITY IS ASSOCIATED WITH AN INCREASED RISK FOR CIRRHOSIS AND ALL CANCERS, INCLUDING ESOPHAGEAL AND COLON CANCER. A WEIGHT OF 185-190 LBS    WILL GET YOUR BODY MASS INDEX(BMI) UNDER 30.  CONTINUE YOUR EFFORTS TO STOP SMOKING.   TO TREAT THE ANAL FISSURE, USE Nitroglycerin ointment 0.4 %. USE a pea sized amount internally four times daily FOR ONE MONTH. IT MAY CAUSE HEADACHES OR DROP IN BLOOD PRESSURE. TAKE FIRST DOSE AND REMAIN SEATED OR LAYING DOWN FOR 10-15 MINS.  YOUR BIOPSY RESULTS WILL BE AVAILABLE IN MY CHART AFTER DEC 21 AND MY OFFICE WILL CONTACT YOU IN 10-14 DAYS WITH YOUR RESULTS.    IF YOU HAVE NEVER HAD AN UPPER ENDOSCOPY YOU SHOULD HAVE ONE TO SCREEN FOR BARRETT'S ESOPHAGUS. PLEASE CONTACT OUR OFFICE WHEN YOUR ARE READY TO SCHEDULE IT.  Next colonoscopy in 5-10 years.    Colonoscopy Care After Read the instructions outlined below and refer to this sheet in the next week. These discharge instructions provide you with general information on caring for yourself after you leave the hospital. While your treatment has been planned according to the most current medical practices available, unavoidable complications occasionally occur. If you have any problems or questions after discharge, call DR. Ethan Kasperski, [phone].  ACTIVITY  You may resume your regular activity, but move at a slower pace for the next 24 hours.   Take frequent rest periods for the next 24 hours.   Walking will help get rid of the air and reduce the bloated feeling in your belly (abdomen).   No driving for 24  hours (because of the medicine (anesthesia) used during the test).   You may shower.   Do not sign any important legal documents or operate any machinery for 24 hours (because of the anesthesia used during the test).    NUTRITION  Drink plenty of fluids.   You may resume your normal diet as instructed by your doctor.   Begin with a light meal and progress to your normal diet. Heavy or fried foods are harder to digest and may make you feel sick to your stomach (nauseated).   Avoid alcoholic beverages for 24 hours or as instructed.    MEDICATIONS  You may resume your normal medications.   WHAT YOU CAN EXPECT TODAY  Some feelings of bloating in the abdomen.   Passage of more gas than usual.   Spotting of blood in your stool or on the toilet paper  .  IF YOU HAD POLYPS REMOVED DURING THE COLONOSCOPY:  Eat a soft diet IF YOU HAVE NAUSEA, BLOATING, ABDOMINAL PAIN, OR VOMITING.    FINDING OUT THE RESULTS OF YOUR TEST Not all test results are available during your visit. DR. Oneida Alar WILL CALL YOU WITHIN 14 DAYS OF YOUR PROCEDUE WITH YOUR RESULTS. Do not assume everything is normal if you have not heard from DR. Shaeley Segall, CALL HER OFFICE AT [phone].  SEEK IMMEDIATE MEDICAL ATTENTION AND CALL THE OFFICE: [phone] IF:  You have more than a spotting of blood in your stool.  Your belly is swollen (abdominal distention).   You are nauseated or vomiting.   You have a temperature over 101F.   You have abdominal pain or discomfort that is severe or gets worse throughout the day.   High-Fiber Diet A high-fiber diet changes your normal diet to include more whole grains, legumes, fruits, and vegetables. Changes in the diet involve replacing refined carbohydrates with unrefined foods. The calorie level of the diet is essentially unchanged. The Dietary Reference Intake (recommended amount) for adult males is 38 grams per day. For adult females, it is 25 grams per day. Pregnant  and lactating women should consume 28 grams of fiber per day. Fiber is the intact part of a plant that is not broken down during digestion. Functional fiber is fiber that has been isolated from the plant to provide a beneficial effect in the body. PURPOSE  Increase stool bulk.   Ease and regulate bowel movements.   Lower cholesterol.   REDUCE RISK OF COLON CANCER  INDICATIONS THAT YOU NEED MORE FIBER  Constipation and hemorrhoids.   Uncomplicated diverticulosis (intestine condition) and irritable bowel syndrome.   Weight management.   As a protective measure against hardening of the arteries (atherosclerosis), diabetes, and cancer.   GUIDELINES FOR INCREASING FIBER IN THE DIET  Start adding fiber to the diet slowly. A gradual increase of about 5 more grams (2 slices of whole-wheat bread, 2 servings of most fruits or vegetables, or 1 bowl of high-fiber cereal) per day is best. Too rapid an increase in fiber may result in constipation, flatulence, and bloating.   Drink enough water and fluids to keep your urine clear or pale yellow. Water, juice, or caffeine-free drinks are recommended. Not drinking enough fluid may cause constipation.   Eat a variety of high-fiber foods rather than one type of fiber.   Try to increase your intake of fiber through using high-fiber foods rather than fiber pills or supplements that contain small amounts of fiber.   The goal is to change the types of food eaten. Do not supplement your present diet with high-fiber foods, but replace foods in your present diet.   INCLUDE A VARIETY OF FIBER SOURCES  Replace refined and processed grains with whole grains, canned fruits with fresh fruits, and incorporate other fiber sources. White rice, white breads, and most bakery goods contain little or no fiber.   Brown whole-grain rice, buckwheat oats, and many fruits and vegetables are all good sources of fiber. These include: broccoli, Brussels sprouts, cabbage,  cauliflower, beets, sweet potatoes, white potatoes (skin on), carrots, tomatoes, eggplant, squash, berries, fresh fruits, and dried fruits.   Cereals appear to be the richest source of fiber. Cereal fiber is found in whole grains and bran. Bran is the fiber-rich outer coat of cereal grain, which is largely removed in refining. In whole-grain cereals, the bran remains. In breakfast cereals, the largest amount of fiber is found in those with "bran" in their names. The fiber content is sometimes indicated on the label.   You may need to include additional fruits and vegetables each day.   In baking, for 1 cup white flour, you may use the following substitutions:   1 cup whole-wheat flour minus 2 tablespoons.   1/2 cup white flour plus 1/2 cup whole-wheat flour.   Polyps, Colon  A polyp is extra tissue that grows inside your body. Colon polyps grow in the large intestine. The large intestine, also called the colon, is part of your digestive system.  It is a long, hollow tube at the end of your digestive tract where your body makes and stores stool. Most polyps are not dangerous. They are benign. This means they are not cancerous. But over time, some types of polyps can turn into cancer. Polyps that are smaller than a pea are usually not harmful. But larger polyps could someday become or may already be cancerous. To be safe, doctors remove all polyps and test them.   WHO GETS POLYPS? Anyone can get polyps, but certain people are more likely than others. You may have a greater chance of getting polyps if:  You are over 50.   You have had polyps before.   Someone in your family has had polyps.   Someone in your family has had cancer of the large intestine.   Find out if someone in your family has had polyps. You may also be more likely to get polyps if you:   Eat a lot of fatty foods   Smoke   Drink alcohol   Do not exercise  Eat too much   PREVENTION There is not one sure way to prevent  polyps. You might be able to lower your risk of getting them if you:  Eat more fruits and vegetables and less fatty food.   Do not smoke.   Avoid alcohol.   Exercise every day.   Lose weight if you are overweight.   Eating more calcium and folate can also lower your risk of getting polyps. Some foods that are rich in calcium are milk, cheese, and broccoli. Some foods that are rich in folate are chickpeas, kidney beans, and spinach.   Hemorrhoids Hemorrhoids are dilated (enlarged) veins around the rectum. Sometimes clots will form in the veins. This makes them swollen and painful. These are called thrombosed hemorrhoids. Causes of hemorrhoids include:  Constipation.   Straining to have a bowel movement.   HEAVY LIFTING  HOME CARE INSTRUCTIONS  Eat a well balanced diet and drink 6 to 8 glasses of water every day to avoid constipation. You may also use a bulk laxative.   Avoid straining to have bowel movements.   Keep anal area dry and clean.   Do not use a donut shaped pillow or sit on the toilet for long periods. This increases blood pooling and pain.   Move your bowels when your body has the urge; this will require less straining and will decrease pain and pressure.

## 2017-12-06 NOTE — H&P (Signed)
  Primary Care Physician:  Kathyrn Drown, MD Primary Gastroenterologist:  Dr. Oneida Alar  Pre-Procedure History & Physical: HPI:  Bruce Hahn is a 56 y.o. male here for COLON CANCER SCREENING.  Past Medical History:  Diagnosis Date  . Hemorrhoids     Past Surgical History:  Procedure Laterality Date  . boil excision  2006    Prior to Admission medications   Medication Sig Start Date End Date Taking? Authorizing Provider  Glucosamine-Chondroitin (OSTEO BI-FLEX REGULAR STRENGTH PO) Take 1 tablet by mouth daily.   Yes [provider]  loratadine (CLARITIN) 10 MG tablet Take 10 mg by mouth daily.   Yes [provider]  Multiple Vitamins-Minerals (ZINC PO) Take 1 capsule by mouth 3 (three) times a week.    Yes [provider]  naproxen sodium (ALEVE) 220 MG tablet Take 220 mg by mouth daily as needed (pain).   Yes [provider]  Omega-3 Fatty Acids (FISH OIL) 1000 MG CAPS Take 1,000-2,000 mg by mouth daily.   Yes [provider]    Allergies as of 11/10/2017 - Review Complete 11/10/2017  Allergen Reaction Noted  . Penicillins  07/10/2013    Family History  Problem Relation Age of Onset  . Colon cancer Neg Hx   . Colon polyps Neg Hx     Social History   Socioeconomic History  . Marital status: Married    Spouse name: Not on file  . Number of children: Not on file  . Years of education: Not on file  . Highest education level: Not on file  Social Needs  . Financial resource strain: Not on file  . Food insecurity - worry: Not on file  . Food insecurity - inability: Not on file  . Transportation needs - medical: Not on file  . Transportation needs - non-medical: Not on file  Occupational History  . Not on file  Tobacco Use  . Smoking status: Current Every Day Smoker    Packs/day: 0.50    Types: Cigarettes  . Smokeless tobacco: Never Used  Substance and Sexual Activity  . Alcohol use: No  . Drug use: No  . Sexual  activity: Yes  Other Topics Concern  . Not on file  Social History Narrative  . Not on file    Review of Systems: See HPI, otherwise negative ROS   Physical Exam: BP (!) 144/93   Pulse 90   Temp 97.6 F (36.4 C) (Oral)   Resp 16   Ht 5\' 7"  (1.702 m)   Wt 200 lb (90.7 kg)   SpO2 96%   BMI 31.32 kg/m  General:   Alert,  pleasant and cooperative in NAD Head:  Normocephalic and atraumatic. Neck:  Supple; Lungs:  Clear throughout to auscultation.    Heart:  Regular rate and rhythm. Abdomen:  Soft, nontender and nondistended. Normal bowel sounds, without guarding, and without rebound.   Neurologic:  Alert and  oriented x4;  grossly normal neurologically.  Impression/Plan:     SCREENING  Plan:  1. TCS TODAY DISCUSSED PROCEDURE, BENEFITS, & RISKS: < 1% chance of medication reaction, bleeding, perforation, or rupture of spleen/liver.

## 2017-12-08 ENCOUNTER — Encounter (HOSPITAL_COMMUNITY): Payer: Self-pay | Admitting: Gastroenterology

## 2017-12-09 ENCOUNTER — Encounter: Payer: Self-pay | Admitting: Gastroenterology

## 2017-12-09 NOTE — Progress Notes (Signed)
PATIENT SCHEDULED  °

## 2017-12-10 NOTE — Progress Notes (Signed)
Pt is aware.  

## 2017-12-13 ENCOUNTER — Ambulatory Visit (INDEPENDENT_AMBULATORY_CARE_PROVIDER_SITE_OTHER): Payer: PRIVATE HEALTH INSURANCE | Admitting: Family Medicine

## 2017-12-13 ENCOUNTER — Encounter: Payer: Self-pay | Admitting: Family Medicine

## 2017-12-13 VITALS — BP 130/84 | HR 80 | Temp 98.6°F | Ht 68.0 in | Wt 209.0 lb

## 2017-12-13 DIAGNOSIS — Z0000 Encounter for general adult medical examination without abnormal findings: Secondary | ICD-10-CM | POA: Diagnosis not present
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
## 2017-12-13 NOTE — Patient Instructions (Addendum)
Fax me your labs [phone]     DASH Eating Plan DASH stands for "Dietary Approaches to Stop Hypertension." The DASH eating plan is a healthy eating plan that has been shown to reduce high blood pressure (hypertension). It may also reduce your risk for type 2 diabetes, heart disease, and stroke. The DASH eating plan may also help with weight loss. What are tips for following this plan? General guidelines  Avoid eating more than 2,300 mg (milligrams) of salt (sodium) a day. If you have hypertension, you may need to reduce your sodium intake to 1,500 mg a day.  Limit alcohol intake to no more than 1 drink a day for nonpregnant women and 2 drinks a day for men. One drink equals 12 oz of beer, 5 oz of wine, or 1 oz of hard liquor.  Work with your health care provider to maintain a healthy body weight or to lose weight. Ask what an ideal weight is for you.  Get at least 30 minutes of exercise that causes your heart to beat faster (aerobic exercise) most days of the week. Activities may include walking, swimming, or biking.  Work with your health care provider or diet and nutrition specialist (dietitian) to adjust your eating plan to your individual calorie needs. Reading food labels  Check food labels for the amount of sodium per serving. Choose foods with less than 5 percent of the Daily Value of sodium. Generally, foods with less than 300 mg of sodium per serving fit into this eating plan.  To find whole grains, look for the word "whole" as the first word in the ingredient list. Shopping  Buy products labeled as "low-sodium" or "no salt added."  Buy fresh foods. Avoid canned foods and premade or frozen meals. Cooking  Avoid adding salt when cooking. Use salt-free seasonings or herbs instead of table salt or sea salt. Check with your health care provider or pharmacist before using salt substitutes.  Do not fry foods. Cook foods using healthy methods such as baking, boiling, grilling,  and broiling instead.  Cook with heart-healthy oils, such as olive, canola, soybean, or sunflower oil. Meal planning   Eat a balanced diet that includes: ? 5 or more servings of fruits and vegetables each day. At each meal, try to fill half of your plate with fruits and vegetables. ? Up to 6-8 servings of whole grains each day. ? Less than 6 oz of lean meat, poultry, or fish each day. A 3-oz serving of meat is about the same size as a deck of cards. One egg equals 1 oz. ? 2 servings of low-fat dairy each day. ? A serving of nuts, seeds, or beans 5 times each week. ? Heart-healthy fats. Healthy fats called Omega-3 fatty acids are found in foods such as flaxseeds and coldwater fish, like sardines, salmon, and mackerel.  Limit how much you eat of the following: ? Canned or prepackaged foods. ? Food that is high in trans fat, such as fried foods. ? Food that is high in saturated fat, such as fatty meat. ? Sweets, desserts, sugary drinks, and other foods with added sugar. ? Full-fat dairy products.  Do not salt foods before eating.  Try to eat at least 2 vegetarian meals each week.  Eat more home-cooked food and less restaurant, buffet, and fast food.  When eating at a restaurant, ask that your food be prepared with less salt or no salt, if possible. What foods are recommended? The items listed may not be  a complete list. Talk with your dietitian about what dietary choices are best for you. Grains Whole-grain or whole-wheat bread. Whole-grain or whole-wheat pasta. Brown rice. [place]row. Bulgur. Whole-grain and low-sodium cereals. Pita bread. Low-fat, low-sodium crackers. Whole-wheat flour tortillas. Vegetables Fresh or frozen vegetables (raw, steamed, roasted, or grilled). Low-sodium or reduced-sodium tomato and vegetable juice. Low-sodium or reduced-sodium tomato sauce and tomato paste. Low-sodium or reduced-sodium canned vegetables. Fruits All fresh, dried, or frozen fruit.  Canned fruit in natural juice (without added sugar). Meat and other protein foods Skinless chicken or [place]. Ground chicken or [place]. Pork with fat trimmed off. Fish and seafood. Egg whites. Dried beans, peas, or lentils. Unsalted nuts, nut butters, and seeds. Unsalted canned beans. Lean cuts of beef with fat trimmed off. Low-sodium, lean deli meat. Dairy Low-fat (1%) or fat-free (skim) milk. Fat-free, low-fat, or reduced-fat cheeses. Nonfat, low-sodium ricotta or cottage cheese. Low-fat or nonfat yogurt. Low-fat, low-sodium cheese. Fats and oils Soft margarine without trans fats. Vegetable oil. Low-fat, reduced-fat, or light mayonnaise and salad dressings (reduced-sodium). Canola, safflower, olive, soybean, and sunflower oils. Avocado. Seasoning and other foods Herbs. Spices. Seasoning mixes without salt. Unsalted popcorn and pretzels. Fat-free sweets. What foods are not recommended? The items listed may not be a complete list. Talk with your dietitian about what dietary choices are best for you. Grains Baked goods made with fat, such as croissants, muffins, or some breads. Dry pasta or rice meal packs. Vegetables Creamed or fried vegetables. Vegetables in a cheese sauce. Regular canned vegetables (not low-sodium or reduced-sodium). Regular canned tomato sauce and paste (not low-sodium or reduced-sodium). Regular tomato and vegetable juice (not low-sodium or reduced-sodium). Angie Fava. Olives. Fruits Canned fruit in a light or heavy syrup. Fried fruit. Fruit in cream or butter sauce. Meat and other protein foods Fatty cuts of meat. Ribs. Fried meat. Berniece Salines. Sausage. Bologna and other processed lunch meats. Salami. Fatback. Hotdogs. Bratwurst. Salted nuts and seeds. Canned beans with added salt. Canned or smoked fish. Whole eggs or egg yolks. Chicken or [place] with skin. Dairy Whole or 2% milk, cream, and half-and-half. Whole or full-fat cream cheese. Whole-fat or sweetened yogurt. Full-fat  cheese. Nondairy creamers. Whipped toppings. Processed cheese and cheese spreads. Fats and oils Butter. Stick margarine. Lard. Shortening. Ghee. Bacon fat. Tropical oils, such as coconut, palm kernel, or palm oil. Seasoning and other foods Salted popcorn and pretzels. Onion salt, garlic salt, seasoned salt, table salt, and sea salt. Worcestershire sauce. Tartar sauce. Barbecue sauce. Teriyaki sauce. Soy sauce, including reduced-sodium. Steak sauce. Canned and packaged gravies. Fish sauce. Oyster sauce. Cocktail sauce. Horseradish that you find on the shelf. Ketchup. Mustard. Meat flavorings and tenderizers. Bouillon cubes. Hot sauce and Tabasco sauce. Premade or packaged marinades. Premade or packaged taco seasonings. Relishes. Regular salad dressings. Where to find more information:  National Heart, Lung, and [place]: https://wilson-eaton.com/  American Heart Association: www.heart.org Summary  The DASH eating plan is a healthy eating plan that has been shown to reduce high blood pressure (hypertension). It may also reduce your risk for type 2 diabetes, heart disease, and stroke.  With the DASH eating plan, you should limit salt (sodium) intake to 2,300 mg a day. If you have hypertension, you may need to reduce your sodium intake to 1,500 mg a day.  When on the DASH eating plan, aim to eat more fresh fruits and vegetables, whole grains, lean proteins, low-fat dairy, and heart-healthy fats.  Work with your health care provider or diet and nutrition specialist (  dietitian) to adjust your eating plan to your individual calorie needs. This information is not intended to replace advice given to you by your health care provider. Make sure you discuss any questions you have with your health care provider. Document Released: 11/26/2011 Document Revised: 11/30/2016 Document Reviewed: 11/30/2016 Elsevier Interactive Patient Education  [employer].

## 2018-03-09 ENCOUNTER — Ambulatory Visit: Payer: PRIVATE HEALTH INSURANCE | Admitting: Nurse Practitioner

## 2018-12-16 ENCOUNTER — Encounter: Payer: Self-pay | Admitting: Family Medicine

## 2018-12-16 ENCOUNTER — Ambulatory Visit (INDEPENDENT_AMBULATORY_CARE_PROVIDER_SITE_OTHER): Payer: PRIVATE HEALTH INSURANCE | Admitting: Family Medicine

## 2018-12-16 VITALS — BP 122/84 | Ht 68.0 in | Wt 204.0 lb

## 2018-12-16 DIAGNOSIS — Z0000 Encounter for general adult medical examination without abnormal findings: Secondary | ICD-10-CM

## 2018-12-16 DIAGNOSIS — Z125 Encounter for screening for malignant neoplasm of prostate: Secondary | ICD-10-CM

## 2018-12-16 NOTE — Patient Instructions (Addendum)
Please have a copy of your lab work forwarded to [place] so we can review it

## 2018-12-16 NOTE — Progress Notes (Signed)
   Subjective:    Patient ID: TEMILOLUWA LAREDO, male    DOB: 1961-09-11, 57 y.o.   MRN: [id number]  HPI The patient comes in today for a wellness visit.  Patient has cut back on smoking He is working harder exercising watching diet losing some weight   A review of their health history was completed.  A review of medications was also completed.  Any needed refills; none  Eating habits: Tries to be health conscious; pt working on diet. No beef or pork for about a month. Cutting down on soft drinks  Falls/  MVA accidents in past few months: no  Regular exercise: Golf, walking, working  Specialist pt sees on regular basis: no  Preventative health issues were discussed.   Additional concerns: none   Review of Systems  Constitutional: Negative for diaphoresis and fatigue.  HENT: Negative for congestion and rhinorrhea.   Respiratory: Negative for cough and shortness of breath.   Cardiovascular: Negative for chest pain and leg swelling.  Gastrointestinal: Negative for abdominal pain and diarrhea.  Skin: Negative for color change and rash.  Neurological: Negative for dizziness and headaches.  Psychiatric/Behavioral: Negative for behavioral problems and confusion.       Objective:   Physical Exam Vitals signs reviewed.  Constitutional:      General: He is not in acute distress. HENT:     Head: Normocephalic and atraumatic.  Eyes:     General:        Right eye: No discharge.        Left eye: No discharge.  Neck:     Trachea: No tracheal deviation.  Cardiovascular:     Rate and Rhythm: Normal rate and regular rhythm.     Heart sounds: Normal heart sounds. No murmur.  Pulmonary:     Effort: Pulmonary effort is normal. No respiratory distress.     Breath sounds: Normal breath sounds.  Lymphadenopathy:     Cervical: No cervical adenopathy.  Skin:    General: Skin is warm and dry.  Neurological:     Mental Status: He is alert.     Coordination: Coordination normal.    Psychiatric:        Behavior: Behavior normal.     Prostate exam normal patient will do PSA      Assessment & Plan:  Adult wellness-complete.wellness physical was conducted today. Importance of diet and exercise were discussed in detail.  In addition to this a discussion regarding safety was also covered. We also reviewed over immunizations and gave recommendations regarding current immunization needed for age.  In addition to this additional areas were also touched on including: Preventative health exams needed:  Colonoscopy he will be due a colonoscopy 2023  He has evidence of a previous treated cyst on his buttock he states occasionally drains a small amount of liquid I told him if this becomes more persistent we would set him up for general surgery to make sure he is not developing a fistula this area does not have any tenderness to it and he states actually looks better compared to where it was  Patient was advised yearly wellness exam

## 2018-12-17 LAB — PSA: Prostate Specific Ag, Serum: 0.4 ng/mL (ref 0.0–4.0)

## 2018-12-18 ENCOUNTER — Encounter: Payer: Self-pay | Admitting: Family Medicine

## 2019-01-06 ENCOUNTER — Telehealth: Payer: Self-pay | Admitting: Family Medicine

## 2019-01-06 ENCOUNTER — Other Ambulatory Visit: Payer: Self-pay | Admitting: Family Medicine

## 2019-01-06 DIAGNOSIS — R234 Changes in skin texture: Secondary | ICD-10-CM

## 2019-01-06 MED ORDER — DOXYCYCLINE HYCLATE 100 MG PO TABS: ORAL_TABLET | ORAL | 0 refills | Status: DC

## 2019-01-06 NOTE — Telephone Encounter (Signed)
Medication sent in and referral placed. Pt informed and verbalized understanding.

## 2019-01-06 NOTE — Telephone Encounter (Signed)
Pt requesting an ontiment in regards of a sore on buttocks, no fever, no nausea or vomiting. Advise.   Pharmacy:  [facility] [place]

## 2019-01-06 NOTE — Telephone Encounter (Signed)
I recommend doxycycline 100 mg 1 twice daily, #20 take with a snack in the tall glass of water  Also referral to Dr. Renold Genta for evaluation

## 2019-01-06 NOTE — Telephone Encounter (Signed)
If the patient can drop by later this afternoon I will look at this If he cannot make it and we will send in antibiotics I believe the patient would be wise also to allow [place] to help set him up with a general surgeon for further evaluation and opinion in the near future

## 2019-01-06 NOTE — Telephone Encounter (Signed)
Left message to return call to get more information 

## 2019-01-06 NOTE — Telephone Encounter (Signed)
Pt states he is unable to make it this evening. Pt states it would be fine to set him up with general surgeon. Please advise. Thank you

## 2019-01-06 NOTE — Telephone Encounter (Signed)
Pt returned call. Pt states he was in for physical in December and provider looked at the area. Pt states it is draining a brownish color fluid, pt states when it drains he feels better. Pt states he has had this on and off for 10 years. Pt states it is swollen a little bit. Pt states he knew he could not get in this late on a Friday afternoon. Pt states he has no fever, no vomiting, no chills. Please advise. Thank you

## 2019-01-09 ENCOUNTER — Encounter: Payer: Self-pay | Admitting: Family Medicine

## 2019-05-05 ENCOUNTER — Telehealth: Payer: Self-pay | Admitting: Family Medicine

## 2019-05-05 MED ORDER — ETODOLAC 400 MG PO TABS: 400.0000 mg | ORAL_TABLET | Freq: Two times a day (BID) | ORAL | 0 refills | Status: DC

## 2019-05-05 NOTE — Telephone Encounter (Signed)
Prescription sent electronically to pharmacy. Left message to return call to notify patient. 

## 2019-05-05 NOTE — Telephone Encounter (Signed)
lodine 400 28 one bid with food

## 2019-05-05 NOTE — Telephone Encounter (Signed)
Pt would like an anti inflammatory called in for a strained rotator cup playing golf. Pt saw Dr. Bethann Goo and he is unable to write a prescription for the pt.     [place], [place] - [address]

## 2019-05-11 ENCOUNTER — Emergency Department (HOSPITAL_COMMUNITY)
Admission: EM | Admit: 2019-05-11 | Discharge: 2019-05-12 | Disposition: A | Discharge status: Home / Self Care | Payer: PRIVATE HEALTH INSURANCE | Attending: Emergency Medicine | Admitting: Emergency Medicine

## 2019-05-11 ENCOUNTER — Emergency Department (HOSPITAL_COMMUNITY): Payer: PRIVATE HEALTH INSURANCE

## 2019-05-11 ENCOUNTER — Other Ambulatory Visit: Payer: Self-pay

## 2019-05-11 ENCOUNTER — Encounter (HOSPITAL_COMMUNITY): Payer: Self-pay

## 2019-05-11 DIAGNOSIS — F17210 Nicotine dependence, cigarettes, uncomplicated: Secondary | ICD-10-CM | POA: Insufficient documentation

## 2019-05-11 DIAGNOSIS — M25462 Effusion, left knee: Secondary | ICD-10-CM | POA: Diagnosis not present

## 2019-05-11 DIAGNOSIS — Z79899 Other long term (current) drug therapy: Secondary | ICD-10-CM | POA: Insufficient documentation

## 2019-05-11 DIAGNOSIS — M25562 Pain in left knee: Secondary | ICD-10-CM | POA: Diagnosis present

## 2019-05-11 MED ORDER — OXYCODONE-ACETAMINOPHEN 5-325 MG PO TABS: 2.0000 | ORAL_TABLET | ORAL | 0 refills | Status: DC | PRN

## 2019-05-11 NOTE — Telephone Encounter (Signed)
Called pharm and he did pick up med.

## 2019-05-11 NOTE — ED Triage Notes (Signed)
Pt presents to ED with complaints of left knee pain which started this evening. Pt states he slipped a little in the rain but did not fall.

## 2019-05-12 NOTE — ED Provider Notes (Signed)
[facility] EMERGENCY DEPARTMENT Provider Note   CSN: [id number] Arrival date & time: 05/11/19  2221    History   Chief Complaint Chief Complaint  Patient presents with  . Knee Pain    HPI Bruce Hahn is a 58 y.o. male.     The history is provided by the patient.  Knee Pain  Location:  Knee Injury: no   Knee location:  L knee Pain details:    Quality:  Aching and sharp   Radiates to:  Does not radiate   Severity:  Moderate   Onset quality:  Gradual   Timing:  Constant   Progression:  Waxing and waning Chronicity:  Recurrent Dislocation: no   Foreign body present:  No foreign bodies Relieved by:  None tried Worsened by:  Nothing Ineffective treatments:  None tried Associated symptoms: no back pain, no fever, no itching, no muscle weakness, no neck pain, no numbness and no stiffness   Risk factors: no concern for non-accidental trauma, no frequent fractures and no known bone disorder     Past Medical History:  Diagnosis Date  . Hemorrhoids     Patient Active Problem List   Diagnosis Date Noted  . Encounter for screening colonoscopy   . Hemorrhoids 11/10/2017  . Fissure in skin 11/10/2017  . Sciatica of left side 07/11/2013    Past Surgical History:  Procedure Laterality Date  . BIOPSY  12/06/2017   Procedure: BIOPSY;  Surgeon: Danie Binder, MD;  Location: [facility];  Service: Endoscopy;;  splenic flexure nodule  . boil excision  2006  . COLONOSCOPY N/A 12/06/2017   Procedure: COLONOSCOPY;  Surgeon: Danie Binder, MD;  Location: [facility];  Service: Endoscopy;  Laterality: N/A;  1:45pm  . POLYPECTOMY  12/06/2017   Procedure: POLYPECTOMY;  Surgeon: Danie Binder, MD;  Location: [facility];  Service: Endoscopy;;  transverse colon        Home Medications    Prior to Admission medications   Medication Sig Start Date End Date Taking? Authorizing Provider  doxycycline (VIBRA-TABS) 100 MG tablet Take one tablet by mouth twice daily;  take with tall glass of water and snack 01/06/19   Kathyrn Drown, MD  etodolac (LODINE) 400 MG tablet Take 1 tablet (400 mg total) by mouth 2 (two) times daily. With food 05/05/19   Luking, Elayne Snare, MD  Glucosamine-Chondroitin (OSTEO BI-FLEX REGULAR STRENGTH PO) Take 1 tablet by mouth daily.    [provider]  loratadine (CLARITIN) 10 MG tablet Take 10 mg by mouth daily.    [provider]  Multiple Vitamins-Minerals (ZINC PO) Take 1 capsule by mouth 3 (three) times a week.     [provider]  naproxen sodium (ALEVE) 220 MG tablet Take 220 mg by mouth daily as needed (pain).    [provider]  Nitroglycerin 0.4 % OINT Place 1 drop rectally 3 (three) times daily. For 30 days to [place]. IT MAY CAUSE HEADACHES. 12/06/17   Fields, Marga Melnick, MD  Omega-3 Fatty Acids (FISH OIL) 1000 MG CAPS Take 1,000-2,000 mg by mouth daily.    [provider]  oxyCODONE-acetaminophen (PERCOCET) 5-325 MG tablet Take 2 tablets by mouth every 4 (four) hours as needed for severe pain. 05/11/19   Larin Depaoli, Corene Cornea, MD    Family History Family History  Problem Relation Age of Onset  . Colon cancer Neg Hx   . Colon polyps Neg Hx     Social History  Social History   Tobacco Use  . Smoking status: Current Every Day Smoker    Packs/day: 0.50    Types: Cigarettes    Last attempt to quit: 11/29/2017    Years since quitting: 1.4  . Smokeless tobacco: Never Used  Substance Use Topics  . Alcohol use: No  . Drug use: No     Allergies   Penicillins   Review of Systems Review of Systems  Constitutional: Negative for fever.  Musculoskeletal: Negative for back pain, neck pain and stiffness.  Skin: Negative for itching.  All other systems reviewed and are negative.    Physical Exam Updated Vital Signs BP (!) 145/103 (BP Location: Right Arm)   Pulse 82   Temp 98.3 F (36.8 C) (Oral)   Resp 17   Ht 5\' 8"  (1.727 m)   Wt 93 kg   SpO2 97%   BMI 31.17 kg/m    Physical Exam Vitals signs and nursing note reviewed.  Constitutional:      Appearance: He is well-developed.  HENT:     Head: Normocephalic and atraumatic.     Mouth/Throat:     Pharynx: Oropharynx is clear.  Eyes:     Extraocular Movements: Extraocular movements intact.     Conjunctiva/sclera: Conjunctivae normal.  Neck:     Musculoskeletal: Normal range of motion.  Cardiovascular:     Rate and Rhythm: Normal rate.  Pulmonary:     Effort: Pulmonary effort is normal. No respiratory distress.  Abdominal:     General: There is no distension.  Musculoskeletal: Normal range of motion.        General: No swelling, tenderness, deformity or signs of injury.     Comments: No swelling, ttp in popliteal fossa  Skin:    General: Skin is warm and dry.  Neurological:     Mental Status: He is alert.      ED Treatments / Results  Labs (all labs ordered are listed, but only abnormal results are displayed) Labs Reviewed - No data to display  EKG None  Radiology Dg Knee Complete 4 Views Left  Result Date: 05/11/2019 CLINICAL DATA:  Left knee pain EXAM: LEFT KNEE - COMPLETE 4+ VIEW COMPARISON:  None. FINDINGS: Mild femorotibial osteophytosis. Small suprapatellar knee effusion. No acute fracture or dislocation. IMPRESSION: Small suprapatellar effusion without acute fracture or dislocation. Electronically Signed   By: Ulyses Jarred M.D.   On: 05/11/2019 23:03    Procedures Procedures (including critical care time)  Medications Ordered in ED Medications - No data to display   Initial Impression / Assessment and Plan / ED Course  I have reviewed the triage vital signs and the nursing notes.  Pertinent labs & imaging results that were available during my care of the patient were reviewed by me and considered in my medical decision making (see chart for details).   Initial ddx for bakers cyst, OA, RA. Without associated symptoms, less suspicion for dvt or septic arthritis. Will  treat symptomatically at this time. pcp follow up if not improving.   Final Clinical Impressions(s) / ED Diagnoses   Final diagnoses:  Effusion of left knee    ED Discharge Orders         Ordered    oxyCODONE-acetaminophen (PERCOCET) 5-325 MG tablet  Every 4 hours PRN     05/11/19 2349           Kenny Stern, Corene Cornea, MD 05/12/19 [phone]

## 2019-05-16 MED FILL — Oxycodone w/ Acetaminophen Tab 5-325 MG: ORAL | Qty: 6 | Status: AC

## 2019-12-18 ENCOUNTER — Encounter: Payer: PRIVATE HEALTH INSURANCE | Admitting: Family Medicine

## 2019-12-19 ENCOUNTER — Telehealth: Payer: Self-pay | Admitting: Family Medicine

## 2019-12-19 NOTE — Telephone Encounter (Signed)
Patient is needing a letter for his insurance company stating that he had to cancel appointment for physical on 12.28/20 due to being quarantine for Covid so his insurance wont go up in January because he didn't get his yearly physical. Please advise

## 2019-12-19 NOTE — Telephone Encounter (Signed)
Please go ahead and give him a letter stating that his physical exam was canceled because he had Covid symptoms and had to be rescheduled until January.  That this issue was outside of his control and therefore his workplace/insurance cost should not go up because of this unforeseeable issue.

## 2020-02-14 ENCOUNTER — Telehealth: Payer: Self-pay | Admitting: Family Medicine

## 2020-02-14 DIAGNOSIS — Z0000 Encounter for general adult medical examination without abnormal findings: Secondary | ICD-10-CM

## 2020-02-14 DIAGNOSIS — Z125 Encounter for screening for malignant neoplasm of prostate: Secondary | ICD-10-CM

## 2020-02-14 NOTE — Telephone Encounter (Signed)
Orders put in and left message to return call to notify pt.  

## 2020-02-14 NOTE — Telephone Encounter (Signed)
Lipid liver metabolic 7 CBC PSA

## 2020-02-14 NOTE — Telephone Encounter (Signed)
Last lab psa dec 2019

## 2020-02-14 NOTE — Telephone Encounter (Signed)
Pt has CPE scheduled 3/22. He would like lab work done before appt.

## 2020-02-20 NOTE — Telephone Encounter (Signed)
Patient notified

## 2020-03-05 LAB — BASIC METABOLIC PANEL
BUN/Creatinine Ratio: 14 (ref 10–24)
BUN: 11 mg/dL (ref 8–27)
CO2: 21 mmol/L (ref 20–29)
Calcium: 9.5 mg/dL (ref 8.6–10.2)
Chloride: 104 mmol/L (ref 96–106)
Creatinine, Ser: 0.78 mg/dL (ref 0.76–1.27)
GFR calc Af Amer: 113 mL/min/{1.73_m2} (ref >59)
GFR calc non Af Amer: 97 mL/min/{1.73_m2} (ref >59)
Glucose: 91 mg/dL (ref 65–99)
Potassium: 4.1 mmol/L (ref 3.5–5.2)
Sodium: 144 mmol/L (ref 134–144)

## 2020-03-05 LAB — CBC WITH DIFFERENTIAL/PLATELET
Basophils Absolute: 0.1 10*3/uL (ref 0.0–0.2)
Basos: 1 %
EOS (ABSOLUTE): 0.2 10*3/uL (ref 0.0–0.4)
Eos: 3 %
Hematocrit: 48.9 % (ref 37.5–51.0)
Hemoglobin: 17.1 g/dL (ref 13.0–17.7)
Immature Grans (Abs): 0.1 10*3/uL (ref 0.0–0.1)
Immature Granulocytes: 1 %
Lymphocytes Absolute: 1.7 10*3/uL (ref 0.7–3.1)
Lymphs: 29 %
MCH: 32.9 pg (ref 26.6–33.0)
MCHC: 35 g/dL (ref 31.5–35.7)
MCV: 94 fL (ref 79–97)
Monocytes Absolute: 0.5 10*3/uL (ref 0.1–0.9)
Monocytes: 9 %
Neutrophils Absolute: 3.2 10*3/uL (ref 1.4–7.0)
Neutrophils: 57 %
Platelets: 268 10*3/uL (ref 150–450)
RBC: 5.19 x10E6/uL (ref 4.14–5.80)
RDW: 12.2 % (ref 11.6–15.4)
WBC: 5.7 10*3/uL (ref 3.4–10.8)

## 2020-03-05 LAB — PSA: Prostate Specific Ag, Serum: 0.6 ng/mL (ref 0.0–4.0)

## 2020-03-05 LAB — HEPATIC FUNCTION PANEL
ALT: 12 IU/L (ref 0–44)
AST: 13 IU/L (ref 0–40)
Albumin: 4.5 g/dL (ref 3.8–4.8)
Alkaline Phosphatase: 84 IU/L (ref 39–117)
Bilirubin Total: 0.6 mg/dL (ref 0.0–1.2)
Bilirubin, Direct: 0.18 mg/dL (ref 0.00–0.40)
Total Protein: 6.8 g/dL (ref 6.0–8.5)

## 2020-03-05 LAB — LIPID PANEL
Chol/HDL Ratio: 4.1 ratio (ref 0.0–5.0)
Cholesterol, Total: 149 mg/dL (ref 100–199)
HDL: 36 mg/dL — ABNORMAL LOW (ref >39)
LDL Chol Calc (NIH): 96 mg/dL (ref 0–99)
Triglycerides: 89 mg/dL (ref 0–149)
VLDL Cholesterol Cal: 17 mg/dL (ref 5–40)

## 2020-03-11 ENCOUNTER — Telehealth: Payer: Self-pay | Admitting: Family Medicine

## 2020-03-11 ENCOUNTER — Ambulatory Visit (INDEPENDENT_AMBULATORY_CARE_PROVIDER_SITE_OTHER): Payer: PRIVATE HEALTH INSURANCE | Admitting: Family Medicine

## 2020-03-11 ENCOUNTER — Other Ambulatory Visit: Payer: Self-pay

## 2020-03-11 ENCOUNTER — Encounter: Payer: Self-pay | Admitting: Family Medicine

## 2020-03-11 VITALS — BP 134/86 | Temp 98.1°F | Ht 67.5 in | Wt 189.0 lb

## 2020-03-11 DIAGNOSIS — Z0000 Encounter for general adult medical examination without abnormal findings: Secondary | ICD-10-CM

## 2020-03-11 DIAGNOSIS — Z716 Tobacco abuse counseling: Secondary | ICD-10-CM

## 2020-03-11 MED ORDER — VARENICLINE TARTRATE 1 MG PO TABS: 1.0000 mg | ORAL_TABLET | Freq: Two times a day (BID) | ORAL | 1 refills | Status: DC

## 2020-03-11 MED ORDER — CHANTIX STARTING MONTH PAK 0.5 MG X 11 & 1 MG X 42 PO TABS: ORAL_TABLET | ORAL | 0 refills | Status: DC

## 2020-03-11 NOTE — Telephone Encounter (Signed)
Pt is double checking on scripts. He was seen this morning and pharmacy has only received one medication. He thought three was being sent in

## 2020-03-11 NOTE — Patient Instructions (Addendum)
Results for orders placed or performed in visit on 02/14/20  Lipid panel  Result Value Ref Range   Cholesterol, Total 149 100 - 199 mg/dL   Triglycerides 89 0 - 149 mg/dL   HDL 36 (L) >39 mg/dL   VLDL Cholesterol Cal 17 5 - 40 mg/dL   LDL Chol Calc (NIH) 96 0 - 99 mg/dL   Chol/HDL Ratio 4.1 0.0 - 5.0 ratio  Hepatic function panel  Result Value Ref Range   Total Protein 6.8 6.0 - 8.5 g/dL   Albumin 4.5 3.8 - 4.8 g/dL   Bilirubin Total 0.6 0.0 - 1.2 mg/dL   Bilirubin, Direct 0.18 0.00 - 0.40 mg/dL   Alkaline Phosphatase 84 39 - 117 IU/L   AST 13 0 - 40 IU/L   ALT 12 0 - 44 IU/L  Basic metabolic panel  Result Value Ref Range   Glucose 91 65 - 99 mg/dL   BUN 11 8 - 27 mg/dL   Creatinine, Ser 0.78 0.76 - 1.27 mg/dL   GFR calc non Af Amer 97 >59 mL/min/1.73   GFR calc Af Amer 113 >59 mL/min/1.73   BUN/Creatinine Ratio 14 10 - 24   Sodium 144 134 - 144 mmol/L   Potassium 4.1 3.5 - 5.2 mmol/L   Chloride 104 96 - 106 mmol/L   CO2 21 20 - 29 mmol/L   Calcium 9.5 8.6 - 10.2 mg/dL  CBC with Differential/Platelet  Result Value Ref Range   WBC 5.7 3.4 - 10.8 x10E3/uL   RBC 5.19 4.14 - 5.80 x10E6/uL   Hemoglobin 17.1 13.0 - 17.7 g/dL   Hematocrit 48.9 37.5 - 51.0 %   MCV 94 79 - 97 fL   MCH 32.9 26.6 - 33.0 pg   MCHC 35.0 31.5 - 35.7 g/dL   RDW 12.2 11.6 - 15.4 %   Platelets 268 150 - 450 x10E3/uL   Neutrophils 57 Not Estab. %   Lymphs 29 Not Estab. %   Monocytes 9 Not Estab. %   Eos 3 Not Estab. %   Basos 1 Not Estab. %   Neutrophils Absolute 3.2 1.4 - 7.0 x10E3/uL   Lymphocytes Absolute 1.7 0.7 - 3.1 x10E3/uL   Monocytes Absolute 0.5 0.1 - 0.9 x10E3/uL   EOS (ABSOLUTE) 0.2 0.0 - 0.4 x10E3/uL   Basophils Absolute 0.1 0.0 - 0.2 x10E3/uL   Immature Granulocytes 1 Not Estab. %   Immature Grans (Abs) 0.1 0.0 - 0.1 x10E3/uL  PSA  Result Value Ref Range   Prostate Specific Ag, Serum 0.6 0.0 - 4.0 ng/mL   Stop fish oil capsule DASH Eating Plan DASH stands for "Dietary  Approaches to Stop Hypertension." The DASH eating plan is a healthy eating plan that has been shown to reduce high blood pressure (hypertension). It may also reduce your risk for type 2 diabetes, heart disease, and stroke. The DASH eating plan may also help with weight loss. What are tips for following this plan?  General guidelines  Avoid eating more than 2,300 mg (milligrams) of salt (sodium) a day. If you have hypertension, you may need to reduce your sodium intake to 1,500 mg a day.  Limit alcohol intake to no more than 1 drink a day for nonpregnant women and 2 drinks a day for men. One drink equals 12 oz of beer, 5 oz of wine, or 1 oz of hard liquor.  Work with your health care provider to maintain a healthy body weight or to lose weight. Ask what an ideal  weight is for you.  Get at least 30 minutes of exercise that causes your heart to beat faster (aerobic exercise) most days of the week. Activities may include walking, swimming, or biking.  Work with your health care provider or diet and nutrition specialist (dietitian) to adjust your eating plan to your individual calorie needs. Reading food labels   Check food labels for the amount of sodium per serving. Choose foods with less than 5 percent of the Daily Value of sodium. Generally, foods with less than 300 mg of sodium per serving fit into this eating plan.  To find whole grains, look for the word "whole" as the first word in the ingredient list. Shopping  Buy products labeled as "low-sodium" or "no salt added."  Buy fresh foods. Avoid canned foods and premade or frozen meals. Cooking  Avoid adding salt when cooking. Use salt-free seasonings or herbs instead of table salt or sea salt. Check with your health care provider or pharmacist before using salt substitutes.  Do not fry foods. Cook foods using healthy methods such as baking, boiling, grilling, and broiling instead.  Cook with heart-healthy oils, such as olive, canola,  soybean, or sunflower oil. Meal planning  Eat a balanced diet that includes: ? 5 or more servings of fruits and vegetables each day. At each meal, try to fill half of your plate with fruits and vegetables. ? Up to 6-8 servings of whole grains each day. ? Less than 6 oz of lean meat, poultry, or fish each day. A 3-oz serving of meat is about the same size as a deck of cards. One egg equals 1 oz. ? 2 servings of low-fat dairy each day. ? A serving of nuts, seeds, or beans 5 times each week. ? Heart-healthy fats. Healthy fats called Omega-3 fatty acids are found in foods such as flaxseeds and coldwater fish, like sardines, salmon, and mackerel.  Limit how much you eat of the following: ? Canned or prepackaged foods. ? Food that is high in trans fat, such as fried foods. ? Food that is high in saturated fat, such as fatty meat. ? Sweets, desserts, sugary drinks, and other foods with added sugar. ? Full-fat dairy products.  Do not salt foods before eating.  Try to eat at least 2 vegetarian meals each week.  Eat more home-cooked food and less restaurant, buffet, and fast food.  When eating at a restaurant, ask that your food be prepared with less salt or no salt, if possible. What foods are recommended? The items listed may not be a complete list. Talk with your dietitian about what dietary choices are best for you. Grains Whole-grain or whole-wheat bread. Whole-grain or whole-wheat pasta. Brown rice. [place]. Bulgur. Whole-grain and low-sodium cereals. Pita bread. Low-fat, low-sodium crackers. Whole-wheat flour tortillas. Vegetables Fresh or frozen vegetables (raw, steamed, roasted, or grilled). Low-sodium or reduced-sodium tomato and vegetable juice. Low-sodium or reduced-sodium tomato sauce and tomato paste. Low-sodium or reduced-sodium canned vegetables. Fruits All fresh, dried, or frozen fruit. Canned fruit in natural juice (without added sugar). Meat and other protein  foods Skinless chicken or [place]. Ground chicken or [place]. Pork with fat trimmed off. Fish and seafood. Egg whites. Dried beans, peas, or lentils. Unsalted nuts, nut butters, and seeds. Unsalted canned beans. Lean cuts of beef with fat trimmed off. Low-sodium, lean deli meat. Dairy Low-fat (1%) or fat-free (skim) milk. Fat-free, low-fat, or reduced-fat cheeses. Nonfat, low-sodium ricotta or cottage cheese. Low-fat or nonfat yogurt. Low-fat, low-sodium cheese. Fats  and oils Soft margarine without trans fats. Vegetable oil. Low-fat, reduced-fat, or light mayonnaise and salad dressings (reduced-sodium). Canola, safflower, olive, soybean, and sunflower oils. Avocado. Seasoning and other foods Herbs. Spices. Seasoning mixes without salt. Unsalted popcorn and pretzels. Fat-free sweets. What foods are not recommended? The items listed may not be a complete list. Talk with your dietitian about what dietary choices are best for you. Grains Baked goods made with fat, such as croissants, muffins, or some breads. Dry pasta or rice meal packs. Vegetables Creamed or fried vegetables. Vegetables in a cheese sauce. Regular canned vegetables (not low-sodium or reduced-sodium). Regular canned tomato sauce and paste (not low-sodium or reduced-sodium). Regular tomato and vegetable juice (not low-sodium or reduced-sodium). Angie Fava. Olives. Fruits Canned fruit in a light or heavy syrup. Fried fruit. Fruit in cream or butter sauce. Meat and other protein foods Fatty cuts of meat. Ribs. Fried meat. Berniece Salines. Sausage. Bologna and other processed lunch meats. Salami. Fatback. Hotdogs. Bratwurst. Salted nuts and seeds. Canned beans with added salt. Canned or smoked fish. Whole eggs or egg yolks. Chicken or [place] with skin. Dairy Whole or 2% milk, cream, and half-and-half. Whole or full-fat cream cheese. Whole-fat or sweetened yogurt. Full-fat cheese. Nondairy creamers. Whipped toppings. Processed cheese and cheese  spreads. Fats and oils Butter. Stick margarine. Lard. Shortening. Ghee. Bacon fat. Tropical oils, such as coconut, palm kernel, or palm oil. Seasoning and other foods Salted popcorn and pretzels. Onion salt, garlic salt, seasoned salt, table salt, and sea salt. Worcestershire sauce. Tartar sauce. Barbecue sauce. Teriyaki sauce. Soy sauce, including reduced-sodium. Steak sauce. Canned and packaged gravies. Fish sauce. Oyster sauce. Cocktail sauce. Horseradish that you find on the shelf. Ketchup. Mustard. Meat flavorings and tenderizers. Bouillon cubes. Hot sauce and Tabasco sauce. Premade or packaged marinades. Premade or packaged taco seasonings. Relishes. Regular salad dressings. Where to find more information:  National Heart, Lung, and [place]: https://wilson-eaton.com/  American Heart Association: www.heart.org Summary  The DASH eating plan is a healthy eating plan that has been shown to reduce high blood pressure (hypertension). It may also reduce your risk for type 2 diabetes, heart disease, and stroke.  With the DASH eating plan, you should limit salt (sodium) intake to 2,300 mg a day. If you have hypertension, you may need to reduce your sodium intake to 1,500 mg a day.  When on the DASH eating plan, aim to eat more fresh fruits and vegetables, whole grains, lean proteins, low-fat dairy, and heart-healthy fats.  Work with your health care provider or diet and nutrition specialist (dietitian) to adjust your eating plan to your individual calorie needs. This information is not intended to replace advice given to you by your health care provider. Make sure you discuss any questions you have with your health care provider. Document Revised: 11/19/2017 Document Reviewed: 11/30/2016 Elsevier Patient Education  2020 [employer].  Steps to Quit Smoking Smoking tobacco is the leading cause of preventable death. It can affect almost every organ in the body. Smoking puts you and people around  you at risk for many serious, long-lasting (chronic) diseases. Quitting smoking can be hard, but it is one of the best things that you can do for your health. It is never too late to quit. How do I get ready to quit? When you decide to quit smoking, make a plan to help you succeed. Before you quit: Pick a date to quit. Set a date within the next 2 weeks to give you time to prepare. Write  down the reasons why you are quitting. Keep this list in places where you will see it often. Tell your family, friends, and co-workers that you are quitting. Their support is important. Talk with your doctor about the choices that may help you quit. Find out if your health insurance will pay for these treatments. Know the people, places, things, and activities that make you want to smoke (triggers). Avoid them. What first steps can I take to quit smoking? Throw away all cigarettes at home, at work, and in your car. Throw away the things that you use when you smoke, such as ashtrays and lighters. Clean your car. Make sure to empty the ashtray. Clean your home, including curtains and carpets. What can I do to help me quit smoking? Talk with your doctor about taking medicines and seeing a counselor at the same time. You are more likely to succeed when you do both. If you are pregnant or breastfeeding, talk with your doctor about counseling or other ways to quit smoking. Do not take medicine to help you quit smoking unless your doctor tells you to do so. To quit smoking: Quit right away Quit smoking totally, instead of slowly cutting back on how much you smoke over a period of time. Go to counseling. You are more likely to quit if you go to counseling sessions regularly. Take medicine You may take medicines to help you quit. Some medicines need a prescription, and some you can buy over-the-counter. Some medicines may contain a drug called nicotine to replace the nicotine in cigarettes. Medicines may: Help you to  stop having the desire to smoke (cravings). Help to stop the problems that come when you stop smoking (withdrawal symptoms). Your doctor may ask you to use: Nicotine patches, gum, or lozenges. Nicotine inhalers or sprays. Non-nicotine medicine that is taken by mouth. Find resources Find resources and other ways to help you quit smoking and remain smoke-free after you quit. These resources are most helpful when you use them often. They include: Online chats with a Social worker. Phone quitlines. Printed Furniture conservator/restorer. Support groups or group counseling. Text messaging programs. Mobile phone apps. Use apps on your mobile phone or tablet that can help you stick to your quit plan. There are many free apps for mobile phones and tablets as well as websites. Examples include Quit Guide from the [employer] and smokefree.gov  What things can I do to make it easier to quit?  Talk to your family and friends. Ask them to support and encourage you. Call a phone quitline (1-800-QUIT-NOW), reach out to support groups, or work with a Social worker. Ask people who smoke to not smoke around you. Avoid places that make you want to smoke, such as: Bars. Parties. Smoke-break areas at work. Spend time with people who do not smoke. Lower the stress in your life. Stress can make you want to smoke. Try these things to help your stress: Getting regular exercise. Doing deep-breathing exercises. Doing yoga. Meditating. Doing a body scan. To do this, close your eyes, focus on one area of your body at a time from head to toe. Notice which parts of your body are tense. Try to relax the muscles in those areas. How will I feel when I quit smoking? Day 1 to 3 weeks Within the first 24 hours, you may start to have some problems that come from quitting tobacco. These problems are very bad 2-3 days after you quit, but they do not often last for more than 2-3  weeks. You may get these symptoms: Mood swings. Feeling restless,  nervous, angry, or annoyed. Trouble concentrating. Dizziness. Strong desire for high-sugar foods and nicotine. Weight gain. Trouble pooping (constipation). Feeling like you may vomit (nausea). Coughing or a sore throat. Changes in how the medicines that you take for other issues work in your body. Depression. Trouble sleeping (insomnia). Week 3 and afterward After the first 2-3 weeks of quitting, you may start to notice more positive results, such as: Better sense of smell and taste. Less coughing and sore throat. Slower heart rate. Lower blood pressure. Clearer skin. Better breathing. Fewer sick days. Quitting smoking can be hard. Do not give up if you fail the first time. Some people need to try a few times before they succeed. Do your best to stick to your quit plan, and talk with your doctor if you have any questions or concerns. Summary Smoking tobacco is the leading cause of preventable death. Quitting smoking can be hard, but it is one of the best things that you can do for your health. When you decide to quit smoking, make a plan to help you succeed. Quit smoking right away, not slowly over a period of time. When you start quitting, seek help from your doctor, family, or friends. This information is not intended to replace advice given to you by your health care provider. Make sure you discuss any questions you have with your health care provider. Document Revised: 09/01/2019 Document Reviewed: 02/25/2019 Elsevier Patient Education  2020 [place]  Walking is a great form of exercise to increase your strength, endurance and overall fitness.  A walking program can help you start slowly and gradually build endurance as you go.  Everyone's ability is different, so each person's starting point will be different.  You do not have to follow them exactly.  The are just samples. You should simply find out what's right for you and stick to that program.   In the beginning,  you'll start off walking 2-3 times a day for short distances.  As you get stronger, you'll be walking further at just 1-2 times per day.  A. You Can Walk For A Certain Length Of Time Each Day    Walk 5 minutes 3 times per day.  Increase 2 minutes every 2 days (3 times per day).  Work up to 25-30 minutes (1-2 times per day).   Example:   Day 1-2 5 minutes 3 times per day   Day 7-8 12 minutes 2-3 times per day   Day 13-14 25 minutes 1-2 times per day  B. You Can Walk For a Certain Distance Each Day     Distance can be substituted for time.    Example:   3 trips to mailbox (at road)   3 trips to corner of block   3 trips around the block  C. Go to local [employer] and use the track.    Walk   time 25 minutes  D. Walk   Please only do the exercises that your therapist has initialed and dated

## 2020-03-11 NOTE — Addendum Note (Signed)
Addended by: Carmelina Noun on: 03/11/2020 01:15 PM   Modules accepted: Orders

## 2020-03-11 NOTE — Progress Notes (Addendum)
Subjective:    Patient ID: Bruce Hahn, male    DOB: [birth year], 59 y.o.   MRN: [id number]  HPI The patient comes in today for a wellness visit.  Patient is trying to do the best he can try to minimize stress some anxiety He is working very hard with his job He is able to stay active with the job unable to do much walking for exercise Patient is a smoker he is cut back to 5 cigarettes he is interested in doing Chantix.  He is requesting prescription for this. We did discuss various measures to quit including cold [place] patches Nicorette gum as well as Chantix we discussed pluses and minuses and the importance of quitting smoking.  Patient has chosen Chantix he has taken it before he denies any major setbacks He is aware if the medication causes depression or suicidal ideations stop the medicine right away and notify us A review of their health history was completed.  A review of medications was also completed.  Any needed refills; not taking any meds but would like rx for doxy  Eating habits: healthy   Falls/  MVA accidents in past few months: none  Regular exercise:  Walks all day at work. Works at [employer] pt sees on regular basis: none  Preventative health issues were discussed.   Additional concerns: some dizziness. Happens about every 6 months. Had it this morning.    if he should continue taking fish oil.   Down to 5 cigarettes a day and would like to discuss taking chantix.   Would like another rx for doxy for fissure Results for orders placed or performed in visit on 02/14/20  Lipid panel  Result Value Ref Range   Cholesterol, Total 149 100 - 199 mg/dL   Triglycerides 89 0 - 149 mg/dL   HDL 36 (L) >39 mg/dL   VLDL Cholesterol Cal 17 5 - 40 mg/dL   LDL Chol Calc (NIH) 96 0 - 99 mg/dL   Chol/HDL Ratio 4.1 0.0 - 5.0 ratio  Hepatic function panel  Result Value Ref Range   Total Protein 6.8 6.0 - 8.5 g/dL   Albumin 4.5 3.8 - 4.8 g/dL   Bilirubin  Total 0.6 0.0 - 1.2 mg/dL   Bilirubin, Direct 0.18 0.00 - 0.40 mg/dL   Alkaline Phosphatase 84 39 - 117 IU/L   AST 13 0 - 40 IU/L   ALT 12 0 - 44 IU/L  Basic metabolic panel  Result Value Ref Range   Glucose 91 65 - 99 mg/dL   BUN 11 8 - 27 mg/dL   Creatinine, Ser 0.78 0.76 - 1.27 mg/dL   GFR calc non Af Amer 97 >59 mL/min/1.73   GFR calc Af Amer 113 >59 mL/min/1.73   BUN/Creatinine Ratio 14 10 - 24   Sodium 144 134 - 144 mmol/L   Potassium 4.1 3.5 - 5.2 mmol/L   Chloride 104 96 - 106 mmol/L   CO2 21 20 - 29 mmol/L   Calcium 9.5 8.6 - 10.2 mg/dL  CBC with Differential/Platelet  Result Value Ref Range   WBC 5.7 3.4 - 10.8 x10E3/uL   RBC 5.19 4.14 - 5.80 x10E6/uL   Hemoglobin 17.1 13.0 - 17.7 g/dL   Hematocrit 48.9 37.5 - 51.0 %   MCV 94 79 - 97 fL   MCH 32.9 26.6 - 33.0 pg   MCHC 35.0 31.5 - 35.7 g/dL   RDW 12.2 11.6 - 15.4 %   Platelets  268 150 - 450 x10E3/uL   Neutrophils 57 Not Estab. %   Lymphs 29 Not Estab. %   Monocytes 9 Not Estab. %   Eos 3 Not Estab. %   Basos 1 Not Estab. %   Neutrophils Absolute 3.2 1.4 - 7.0 x10E3/uL   Lymphocytes Absolute 1.7 0.7 - 3.1 x10E3/uL   Monocytes Absolute 0.5 0.1 - 0.9 x10E3/uL   EOS (ABSOLUTE) 0.2 0.0 - 0.4 x10E3/uL   Basophils Absolute 0.1 0.0 - 0.2 x10E3/uL   Immature Granulocytes 1 Not Estab. %   Immature Grans (Abs) 0.1 0.0 - 0.1 x10E3/uL  PSA  Result Value Ref Range   Prostate Specific Ag, Serum 0.6 0.0 - 4.0 ng/mL      Review of Systems  Constitutional: Negative for activity change, appetite change and fever.  HENT: Negative for congestion and rhinorrhea.   Eyes: Negative for discharge.  Respiratory: Negative for cough and wheezing.   Cardiovascular: Negative for chest pain.  Gastrointestinal: Negative for abdominal pain, blood in stool and vomiting.  Genitourinary: Negative for difficulty urinating and frequency.  Musculoskeletal: Negative for neck pain.  Skin: Negative for rash.  Allergic/Immunologic: Negative  for environmental allergies and food allergies.  Neurological: Negative for weakness and headaches.  Psychiatric/Behavioral: Negative for agitation.       Objective:   Physical Exam Constitutional:      Appearance: He is well-developed.  HENT:     Head: Normocephalic and atraumatic.     Right Ear: External ear normal.     Left Ear: External ear normal.     Nose: Nose normal.  Eyes:     Pupils: Pupils are equal, round, and reactive to light.  Neck:     Thyroid: No thyromegaly.  Cardiovascular:     Rate and Rhythm: Normal rate and regular rhythm.     Heart sounds: Normal heart sounds. No murmur.  Pulmonary:     Effort: Pulmonary effort is normal. No respiratory distress.     Breath sounds: Normal breath sounds. No wheezing.  Abdominal:     General: Bowel sounds are normal. There is no distension.     Palpations: Abdomen is soft. There is no mass.     Tenderness: There is no abdominal tenderness.  Genitourinary:    Penis: Normal.   Musculoskeletal:        General: Normal range of motion.     Cervical back: Normal range of motion and neck supple.  Lymphadenopathy:     Cervical: No cervical adenopathy.  Skin:    General: Skin is warm and dry.     Findings: No erythema.  Neurological:     Mental Status: He is alert.     Motor: No abnormal muscle tone.  Psychiatric:        Behavior: Behavior normal.        Judgment: Judgment normal.   Prostate exam normal Blood pressure recheck good        Assessment & Plan:  Adult wellness-complete.wellness physical was conducted today. Importance of diet and exercise were discussed in detail.  In addition to this a discussion regarding safety was also covered. We also reviewed over immunizations and gave recommendations regarding current immunization needed for age.  In addition to this additional areas were also touched on including: Preventative health exams needed:  Colonoscopy patient is up-to-date  Patient was advised  yearly wellness exam Chantix prescribed Warning signs discussed Quitting smoking counseling given Labs were reviewed in detail with patient.  I am pleased  with how he is doing but he does need to start doing walking on a regular basis in addition to quitting smoking and healthy eating follow-up again on a yearly basis sooner if any problems

## 2020-03-12 MED ORDER — MECLIZINE HCL 25 MG PO TABS: 25.0000 mg | ORAL_TABLET | Freq: Three times a day (TID) | ORAL | 1 refills | Status: DC | PRN

## 2020-03-12 NOTE — Telephone Encounter (Signed)
Prescription sent electronically to pharmacy. Patient notified. 

## 2020-03-12 NOTE — Telephone Encounter (Signed)
Pts wife is calling now checking on if Dr. Nicki Reaper was going to call something in for the inner ear pt brought up yesterday. He was up all night with the room spinning. It is his right ear.   [facility]

## 2020-03-12 NOTE — Telephone Encounter (Signed)
Meclizine 25 mg 1 taken 3 times daily as needed for dizziness, #30 with 1 refill

## 2020-04-15 ENCOUNTER — Telehealth: Payer: Self-pay | Admitting: Family Medicine

## 2020-04-15 MED ORDER — DOXYCYCLINE HYCLATE 100 MG PO TABS: ORAL_TABLET | ORAL | 0 refills | Status: DC

## 2020-04-15 NOTE — Telephone Encounter (Signed)
Medication sent to pharmacy and pt is aware 

## 2020-04-15 NOTE — Telephone Encounter (Signed)
Doxycycline 100 mg 1 twice daily for the next 7 days May also do warm compresses frequently If progressively worse I recommend follow-up office visit

## 2020-04-15 NOTE — Telephone Encounter (Signed)
Pt would like to know if medication can be called in for anal fisher. States it is swollen and infected. He doesn't have time to come to office for visit.   [place], [place] - [address]

## 2020-04-15 NOTE — Telephone Encounter (Signed)
Seen 03/11/20 for fissure and prescribed doxy

## 2020-06-14 IMAGING — DX LEFT KNEE - COMPLETE 4+ VIEW
4 series · 4 of 4 positions shown · non-contrast
Comparison: None.

CLINICAL DATA: Left knee pain

EXAM:
LEFT KNEE - COMPLETE 4+ VIEW

[knee ap (1 of 3)]
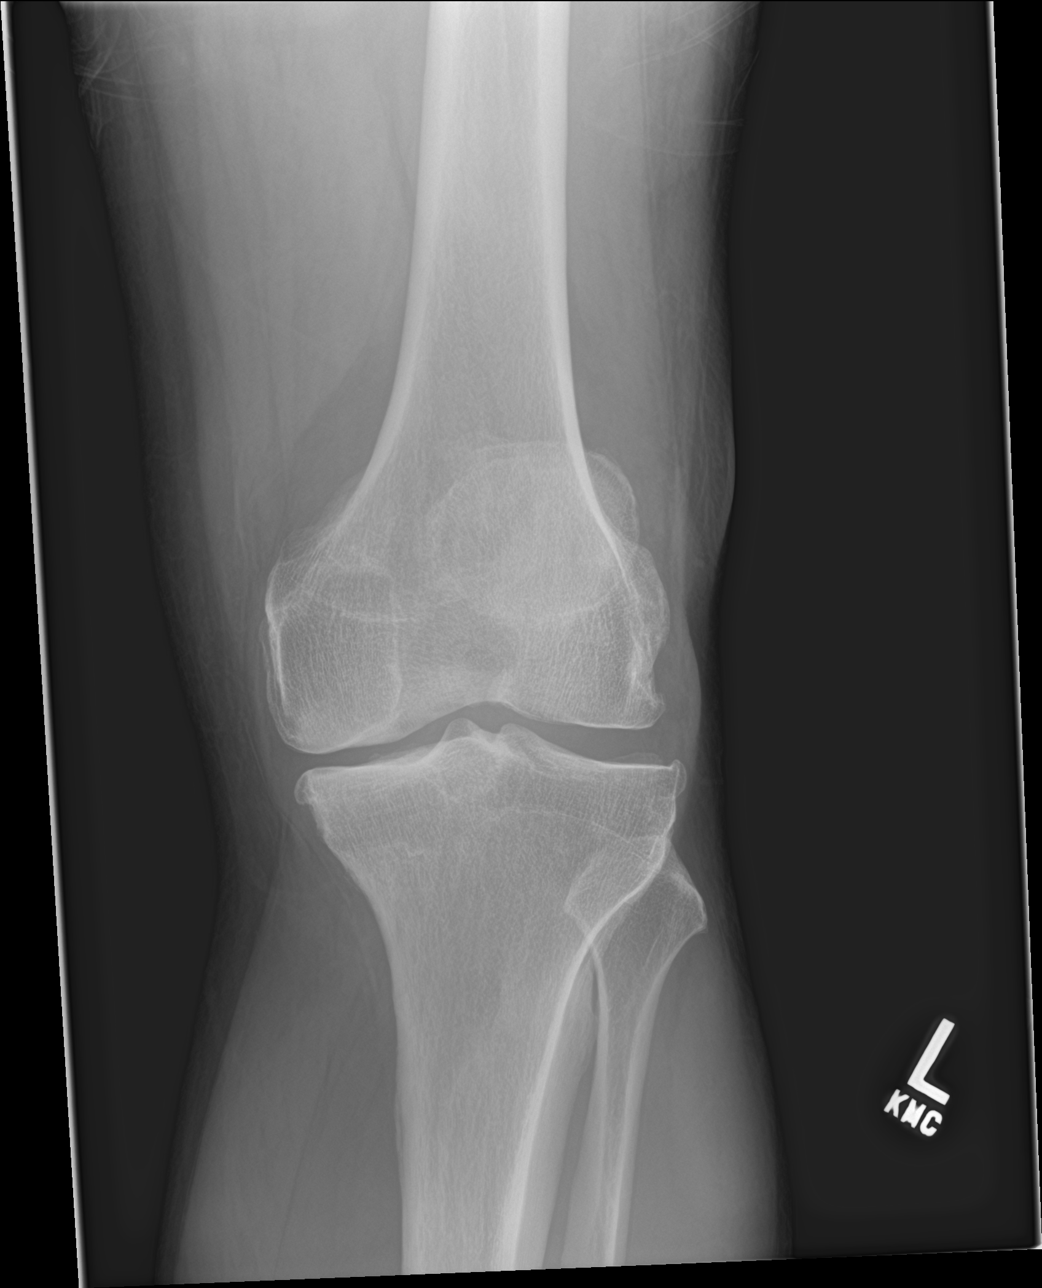

[knee lat]
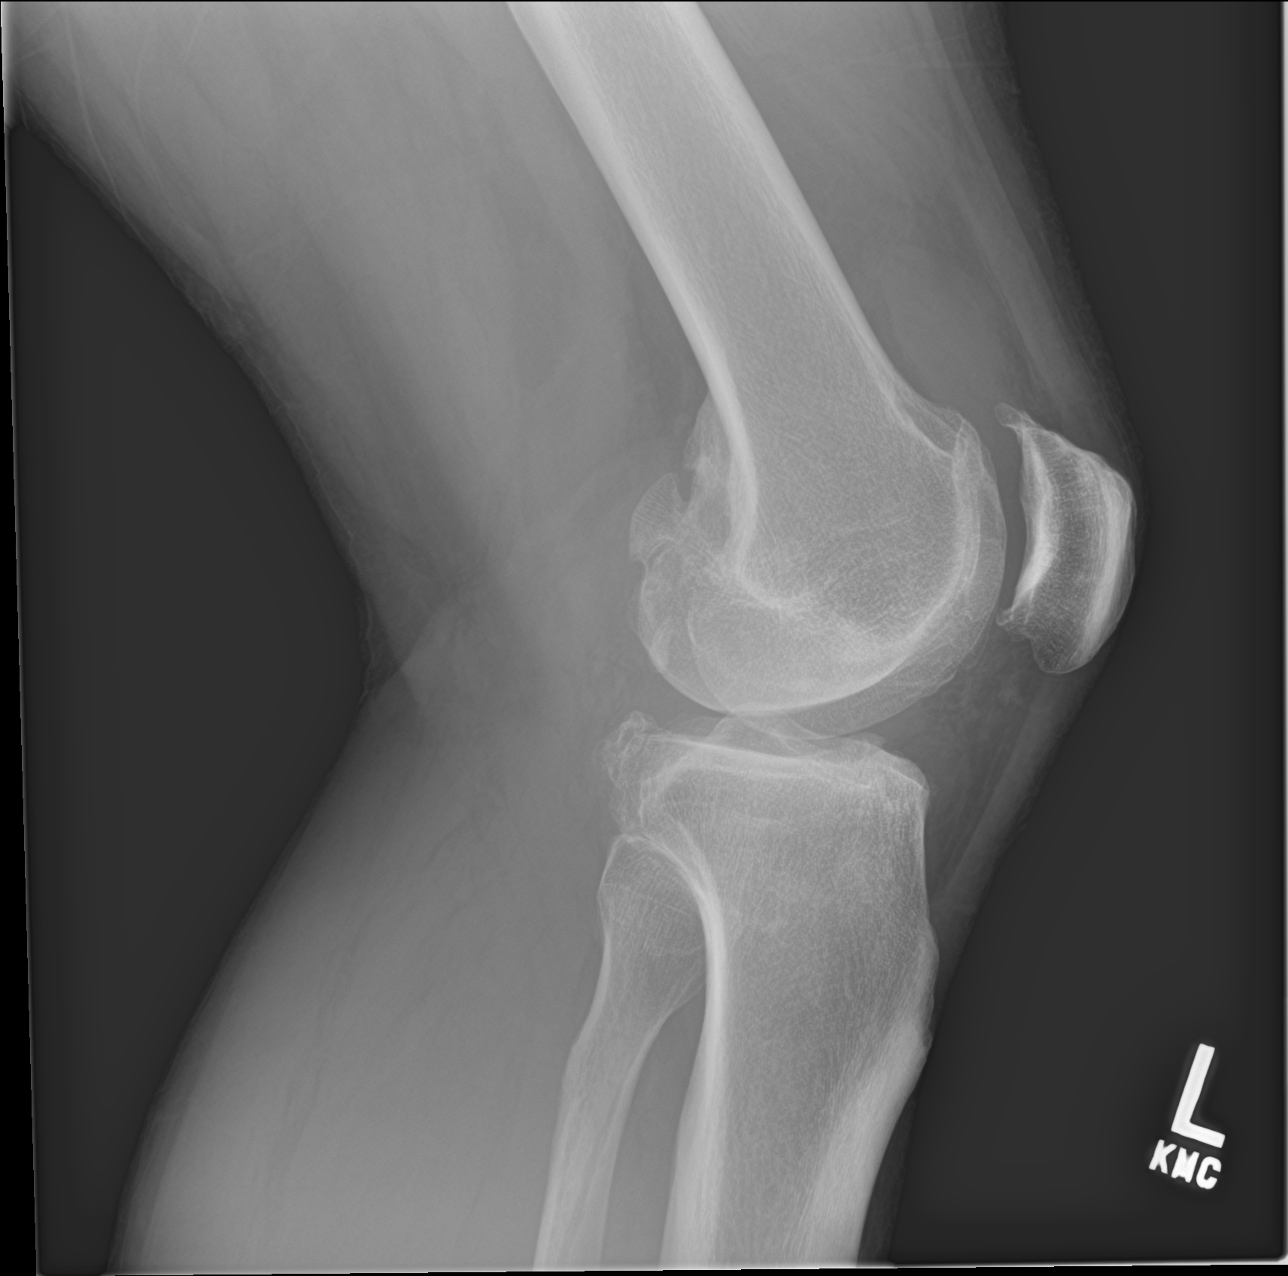

[knee ap (2 of 3)]
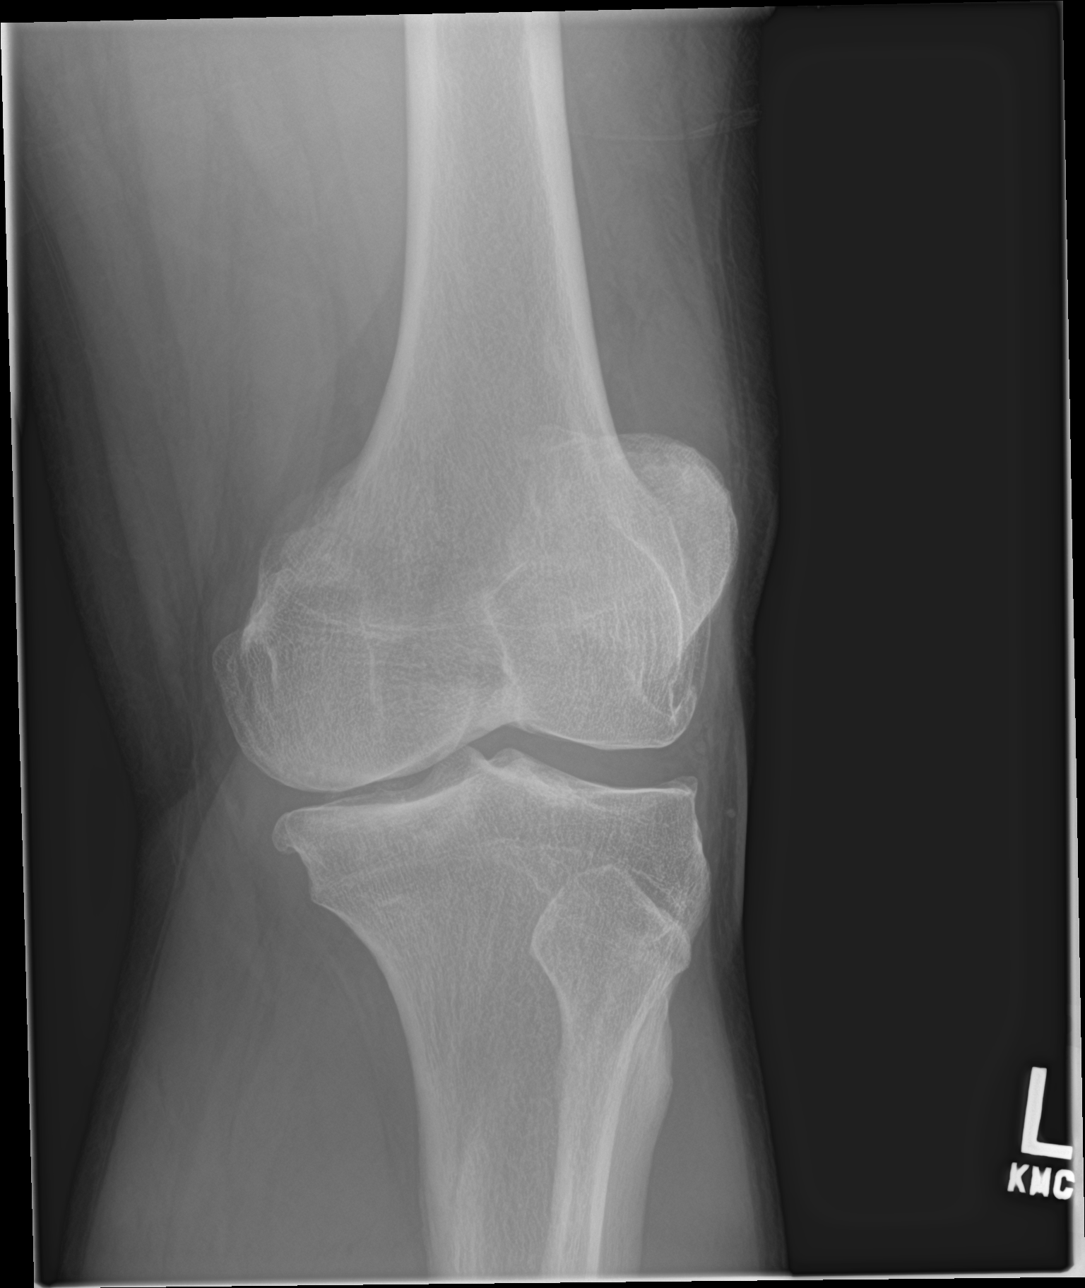

[knee ap (3 of 3)]
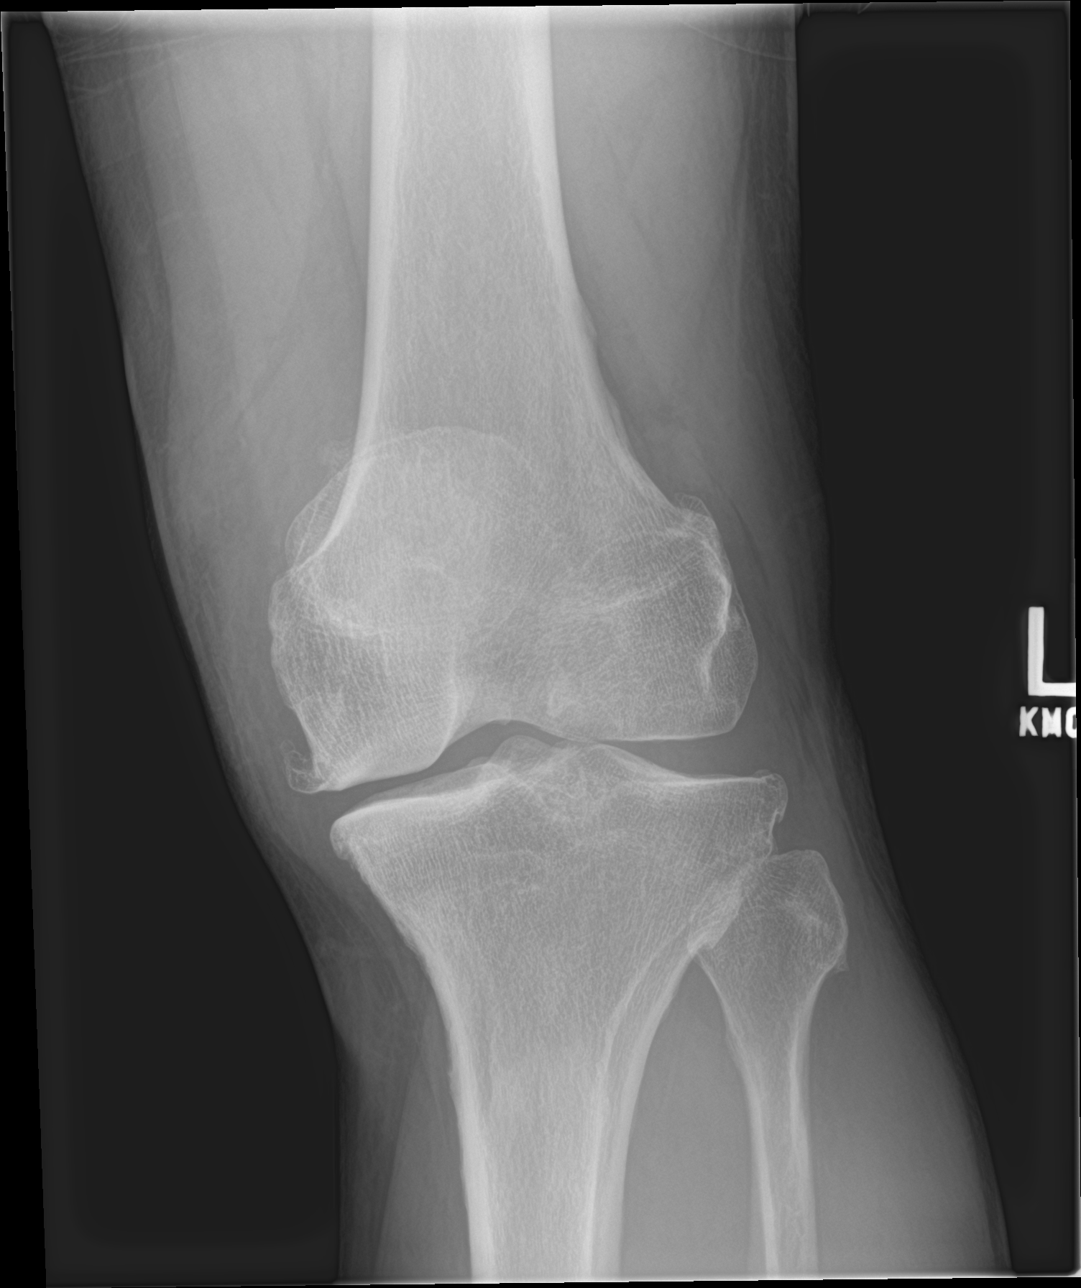

[4 of 4 positions shown; findings below may reference images not displayed]

FINDINGS: Mild femorotibial osteophytosis. Small suprapatellar knee effusion.
No acute fracture or dislocation.
IMPRESSION: Small suprapatellar effusion without acute fracture or dislocation.

## 2021-10-06 ENCOUNTER — Other Ambulatory Visit (HOSPITAL_COMMUNITY): Payer: Self-pay

## 2021-10-06 MED ORDER — INFLUENZA VAC SPLIT QUAD 0.5 ML IM SUSY
PREFILLED_SYRINGE | INTRAMUSCULAR | 0 refills | Status: DC
  Filled 2021-10-06: qty 0.5, 1d supply, fill #0

## 2021-11-21 ENCOUNTER — Other Ambulatory Visit: Payer: Self-pay

## 2021-11-21 ENCOUNTER — Ambulatory Visit (INDEPENDENT_AMBULATORY_CARE_PROVIDER_SITE_OTHER): Payer: PRIVATE HEALTH INSURANCE | Admitting: Family Medicine

## 2021-11-21 ENCOUNTER — Encounter: Payer: Self-pay | Admitting: Family Medicine

## 2021-11-21 VITALS — BP 164/102 | Temp 98.1°F | Ht 65.75 in | Wt 188.6 lb

## 2021-11-21 DIAGNOSIS — Z0000 Encounter for general adult medical examination without abnormal findings: Secondary | ICD-10-CM | POA: Diagnosis not present

## 2021-11-21 DIAGNOSIS — Z125 Encounter for screening for malignant neoplasm of prostate: Secondary | ICD-10-CM | POA: Diagnosis not present

## 2021-11-21 DIAGNOSIS — Z13220 Encounter for screening for lipoid disorders: Secondary | ICD-10-CM | POA: Diagnosis not present

## 2021-11-21 NOTE — Patient Instructions (Signed)

## 2021-11-21 NOTE — Progress Notes (Signed)
   Subjective:    Patient ID: Bruce Hahn, male    DOB: 1961/08/27, 60 y.o.   MRN: [id number]  HPI The patient comes in today for a wellness visit.  Patient is trying to eat healthy Works a very stressful job Denies any chest pain shortness of breath Bowel movements going okay Urination going okay   A review of their health history was completed.  A review of medications was also completed.  Any needed refills; not on any current meds  Eating habits: healthy   Falls/  MVA accidents in past few months: none  Regular exercise: yes  Specialist pt sees on regular basis: none  Preventative health issues were discussed.   Additional concerns: none    Review of Systems     Objective:   Physical Exam  General-in no acute distress Eyes-no discharge Lungs-respiratory rate normal, CTA CV-no murmurs,RRR Extremities skin warm dry no edema Neuro grossly normal Behavior normal, alert  Patient still smokes but he states he is cutting back and feels he will be able to quit does not want to go on any type of medication currently.  We will work hard on dietary measures Stated prostate exam normal    Assessment & Plan:  Adult wellness-complete.wellness physical was conducted today. Importance of diet and exercise were discussed in detail.  In addition to this a discussion regarding safety was also covered. We also reviewed over immunizations and gave recommendations regarding current immunization needed for age.  In addition to this additional areas were also touched on including: Preventative health exams needed:  Colonoscopy 2023  Patient was advised yearly wellness exam COVID-vaccine Pneumococcal vaccine All recommended Lab work pending  Patient strongly encouraged to quit smoking

## 2021-12-24 LAB — BASIC METABOLIC PANEL
BUN/Creatinine Ratio: 17 (ref 10–24)
BUN: 15 mg/dL (ref 8–27)
CO2: 22 mmol/L (ref 20–29)
Calcium: 9.2 mg/dL (ref 8.6–10.2)
Chloride: 105 mmol/L (ref 96–106)
Creatinine, Ser: 0.88 mg/dL (ref 0.76–1.27)
Glucose: 92 mg/dL (ref 70–99)
Potassium: 4.4 mmol/L (ref 3.5–5.2)
Sodium: 141 mmol/L (ref 134–144)
eGFR: 97 mL/min/{1.73_m2} (ref >59)

## 2021-12-24 LAB — CBC WITH DIFFERENTIAL/PLATELET
Basophils Absolute: 0.1 10*3/uL (ref 0.0–0.2)
Basos: 1 %
EOS (ABSOLUTE): 0.2 10*3/uL (ref 0.0–0.4)
Eos: 2 %
Hematocrit: 48 % (ref 37.5–51.0)
Hemoglobin: 16.5 g/dL (ref 13.0–17.7)
Immature Grans (Abs): 0.1 10*3/uL (ref 0.0–0.1)
Immature Granulocytes: 2 %
Lymphocytes Absolute: 2.1 10*3/uL (ref 0.7–3.1)
Lymphs: 26 %
MCH: 32 pg (ref 26.6–33.0)
MCHC: 34.4 g/dL (ref 31.5–35.7)
MCV: 93 fL (ref 79–97)
Monocytes Absolute: 0.8 10*3/uL (ref 0.1–0.9)
Monocytes: 10 %
Neutrophils Absolute: 4.8 10*3/uL (ref 1.4–7.0)
Neutrophils: 59 %
Platelets: 266 10*3/uL (ref 150–450)
RBC: 5.16 x10E6/uL (ref 4.14–5.80)
RDW: 11.9 % (ref 11.6–15.4)
WBC: 8.1 10*3/uL (ref 3.4–10.8)

## 2021-12-24 LAB — PSA: Prostate Specific Ag, Serum: 0.6 ng/mL (ref 0.0–4.0)

## 2021-12-24 LAB — LIPID PANEL
Chol/HDL Ratio: 3.5 ratio (ref 0.0–5.0)
Cholesterol, Total: 159 mg/dL (ref 100–199)
HDL: 45 mg/dL (ref >39)
LDL Chol Calc (NIH): 101 mg/dL — ABNORMAL HIGH (ref 0–99)
Triglycerides: 64 mg/dL (ref 0–149)
VLDL Cholesterol Cal: 13 mg/dL (ref 5–40)

## 2021-12-25 ENCOUNTER — Ambulatory Visit: Payer: No Typology Code available for payment source | Admitting: Family Medicine

## 2021-12-26 ENCOUNTER — Telehealth: Payer: Self-pay | Admitting: *Deleted

## 2021-12-26 ENCOUNTER — Other Ambulatory Visit: Payer: Self-pay

## 2021-12-26 ENCOUNTER — Ambulatory Visit (INDEPENDENT_AMBULATORY_CARE_PROVIDER_SITE_OTHER): Payer: PRIVATE HEALTH INSURANCE | Admitting: Family Medicine

## 2021-12-26 DIAGNOSIS — E785 Hyperlipidemia, unspecified: Secondary | ICD-10-CM

## 2021-12-26 NOTE — Telephone Encounter (Signed)
[name], ibarra are scheduled for a virtual visit with your provider today.    Just as we do with appointments in the office, we must obtain your consent to participate.  Your consent will be active for this visit and any virtual visit you may have with one of our providers in the next 365 days.    If you have a MyChart account, I can also send a copy of this consent to you electronically.  All virtual visits are billed to your insurance company just like a traditional visit in the office.  As this is a virtual visit, video technology does not allow for your provider to perform a traditional examination.  This may limit your provider's ability to fully assess your condition.  If your provider identifies any concerns that need to be evaluated in person or the need to arrange testing such as labs, EKG, etc, we will make arrangements to do so.    Although advances in technology are sophisticated, we cannot ensure that it will always work on either your end or our end.  If the connection with a video visit is poor, we may have to switch to a telephone visit.  With either a video or telephone visit, we are not always able to ensure that we have a secure connection.   I need to obtain your verbal consent now.   Are you willing to proceed with your visit today?   Bruce Hahn has provided verbal consent on 12/26/2021 for a virtual visit (video or telephone).   Ned Card, LPN 04/26/4733  03:70 AM

## 2021-12-26 NOTE — Progress Notes (Signed)
° °  Subjective:    Patient ID: Bruce Hahn, male    DOB: [birth year], 61 y.o.   MRN: [id number]  HPI Patient has 6 week follow up to go over lab work.  Virtual Visit via Telephone Note  I connected with Bruce Hahn on 12/26/21 at  2:10 PM EST by telephone and verified that I am speaking with the correct person using two identifiers.  Location: Patient: home Provider: office   I discussed the limitations, risks, security and privacy concerns of performing an evaluation and management service by telephone and the availability of in person appointments. I also discussed with the patient that there may be a patient responsible charge related to this service. The patient expressed understanding and agreed to proceed.   History of Present Illness:    Observations/Objective:   Assessment and Plan:   Follow Up Instructions:    I discussed the assessment and treatment plan with the patient. The patient was provided an opportunity to ask questions and all were answered. The patient agreed with the plan and demonstrated an understanding of the instructions.   The patient was advised to call back or seek an in-person evaluation if the symptoms worsen or if the condition fails to improve as anticipated.  I provided  15 minutes of non-face-to-face time during this encounter.     Review of Systems     Objective:   Physical Exam  Today's visit was via telephone Physical exam was not possible for this visit       Assessment & Plan:  Long discussion held regarding risk for heart disease Patient currently does not want to go on statin But he is open to doing coronary artery calcium score We will go ahead with this He will follow-up if progressive troubles Await the results of that

## 2021-12-26 NOTE — Progress Notes (Signed)
12/26/2021-Coronary Calcium score ordered for [facility]

## 2022-03-12 ENCOUNTER — Ambulatory Visit (HOSPITAL_COMMUNITY): Payer: PRIVATE HEALTH INSURANCE

## 2022-11-06 ENCOUNTER — Encounter: Payer: Self-pay | Admitting: *Deleted

## 2022-11-23 ENCOUNTER — Telehealth: Payer: Self-pay

## 2022-11-23 DIAGNOSIS — Z79899 Other long term (current) drug therapy: Secondary | ICD-10-CM

## 2022-11-23 DIAGNOSIS — Z1159 Encounter for screening for other viral diseases: Secondary | ICD-10-CM

## 2022-11-23 DIAGNOSIS — Z125 Encounter for screening for malignant neoplasm of prostate: Secondary | ICD-10-CM

## 2022-11-23 DIAGNOSIS — Z114 Encounter for screening for human immunodeficiency virus [HIV]: Secondary | ICD-10-CM

## 2022-11-23 DIAGNOSIS — E785 Hyperlipidemia, unspecified: Secondary | ICD-10-CM

## 2022-11-23 NOTE — Telephone Encounter (Signed)
Caller name: Bruce Hahn  On DPR?: Yes  Call back number: [phone] (mobile)  Provider they see: Kathyrn Drown, MD  Reason for call:Pt has Phy coming up Dec 26th and needs blood work ordered

## 2022-11-23 NOTE — Telephone Encounter (Signed)
Lipid, liver, metabolic 7, PSA, HIV antibody, hep C antibody Screening per CDC guidelines Wellness

## 2022-11-23 NOTE — Telephone Encounter (Signed)
Last labs completed 12/23/2021 PSA, Lipid, CBC w/Diff/Platelets, BMET. Please advise. Thank you.

## 2022-11-23 NOTE — Telephone Encounter (Signed)
Blood work ordered in EPIC. Patient notified. 

## 2022-11-26 ENCOUNTER — Encounter: Payer: PRIVATE HEALTH INSURANCE | Admitting: Family Medicine

## 2022-12-09 LAB — BASIC METABOLIC PANEL
BUN/Creatinine Ratio: 16 (ref 10–24)
BUN: 14 mg/dL (ref 8–27)
CO2: 20 mmol/L (ref 20–29)
Calcium: 9.1 mg/dL (ref 8.6–10.2)
Chloride: 107 mmol/L — ABNORMAL HIGH (ref 96–106)
Creatinine, Ser: 0.87 mg/dL (ref 0.76–1.27)
Glucose: 98 mg/dL (ref 70–99)
Potassium: 4.5 mmol/L (ref 3.5–5.2)
Sodium: 142 mmol/L (ref 134–144)
eGFR: 97 mL/min/{1.73_m2} (ref >59)

## 2022-12-09 LAB — LIPID PANEL
Chol/HDL Ratio: 4 ratio (ref 0.0–5.0)
Cholesterol, Total: 165 mg/dL (ref 100–199)
HDL: 41 mg/dL (ref >39)
LDL Chol Calc (NIH): 109 mg/dL — ABNORMAL HIGH (ref 0–99)
Triglycerides: 79 mg/dL (ref 0–149)
VLDL Cholesterol Cal: 15 mg/dL (ref 5–40)

## 2022-12-09 LAB — HEPATIC FUNCTION PANEL
ALT: 19 IU/L (ref 0–44)
AST: 17 IU/L (ref 0–40)
Albumin: 4.6 g/dL (ref 3.9–4.9)
Alkaline Phosphatase: 69 IU/L (ref 44–121)
Bilirubin Total: 0.5 mg/dL (ref 0.0–1.2)
Bilirubin, Direct: 0.14 mg/dL (ref 0.00–0.40)
Total Protein: 6.8 g/dL (ref 6.0–8.5)

## 2022-12-09 LAB — HIV ANTIBODY (ROUTINE TESTING W REFLEX): HIV Screen 4th Generation wRfx: NONREACTIVE

## 2022-12-09 LAB — PSA: Prostate Specific Ag, Serum: 0.6 ng/mL (ref 0.0–4.0)

## 2022-12-09 LAB — HEPATITIS C ANTIBODY: Hep C Virus Ab: NONREACTIVE

## 2022-12-15 ENCOUNTER — Ambulatory Visit (INDEPENDENT_AMBULATORY_CARE_PROVIDER_SITE_OTHER): Payer: PRIVATE HEALTH INSURANCE | Admitting: Family Medicine

## 2022-12-15 VITALS — BP 136/88 | HR 94 | Temp 98.0°F | Ht 67.0 in | Wt 195.2 lb

## 2022-12-15 DIAGNOSIS — Z0000 Encounter for general adult medical examination without abnormal findings: Secondary | ICD-10-CM

## 2022-12-15 NOTE — Progress Notes (Signed)
   Subjective:    Patient ID: Bruce Hahn, male    DOB: [birth year], 61 y.o.   MRN: [id number]  HPI The patient comes in today for a wellness visit.  Patient has been encouraged to quit smoking He states he is cut it down to 1 pack/week and thinks he can quit in the next several weeks or months He relates an interest in doing CT scan of the chest-coronary calcium-but also relates he has put this off because of so many other issues going on A review of their health history was completed.  A review of medications was also completed.  Any needed refills; no  Eating habits: trying to eat healthy  Falls/  MVA accidents in past few months: no  Regular exercise: golfing  Specialist pt sees on regular basis: no  Preventative health issues were discussed.   Additional concerns: none   The 10-year ASCVD risk score (Arnett DK, et al., 2019) is: 17.1%   Values used to calculate the score:     Age: 61 years     Sex: Male     Is Non-Hispanic African American: No     Diabetic: No     Tobacco smoker: Yes     Systolic Blood Pressure: 321 mmHg     Is BP treated: No     HDL Cholesterol: 41 mg/dL     Total Cholesterol: 165 mg/dL    Review of Systems     Objective:   Physical Exam  General-in no acute distress Eyes-no discharge Lungs-respiratory rate normal, CTA CV-no murmurs,RRR Extremities skin warm dry no edema Neuro grossly normal Behavior normal, alert  Abdomen is soft recommend ultrasound of abdomen at age 1     Assessment & Plan:  There are no diagnoses linked to this encounter. Adult wellness-complete.wellness physical was conducted today. Importance of diet and exercise were discussed in detail.  Importance of stress reduction and healthy living were discussed.  In addition to this a discussion regarding safety was also covered.  We also reviewed over immunizations and gave recommendations regarding current immunization needed for age.   In addition to this  additional areas were also touched on including: Preventative health exams needed:  Colonoscopy patient states he will connect to get his colonoscopy set up  Patient was advised yearly wellness exam  I recommend a low-dose CT coronary artery scan patient states he will do this in June  Patient defers on prostate exam PSA looks good  Patient working hard at quitting smoking

## 2023-10-21 ENCOUNTER — Encounter: Payer: PRIVATE HEALTH INSURANCE | Admitting: Family Medicine

## 2024-04-14 ENCOUNTER — Ambulatory Visit (INDEPENDENT_AMBULATORY_CARE_PROVIDER_SITE_OTHER): Payer: PRIVATE HEALTH INSURANCE | Admitting: Physician Assistant

## 2024-04-14 ENCOUNTER — Encounter: Payer: Self-pay | Admitting: Physician Assistant

## 2024-04-14 VITALS — BP 144/86 | HR 84 | Temp 98.2°F | Ht 67.0 in | Wt 195.0 lb

## 2024-04-14 DIAGNOSIS — M1712 Unilateral primary osteoarthritis, left knee: Secondary | ICD-10-CM

## 2024-04-14 MED ORDER — MELOXICAM 15 MG PO TABS: 15.0000 mg | ORAL_TABLET | Freq: Every day | ORAL | 0 refills | Status: DC

## 2024-04-14 NOTE — Assessment & Plan Note (Addendum)
 Patient presents today with chronic osteoarthritis of his left knee. Frequently popping and clicking, with moderate swelling and effusions in the evenings. He denies significant pain but endorses stiffness. Referral placed to Dr. Donnella Gain per patient request. Meloxicam for swelling and inflammation. I advised knee brace and daily icing as well.

## 2024-04-14 NOTE — Progress Notes (Signed)
   Established Patient Office Visit  Subjective   Patient ID: Bruce Hahn, male    DOB: 04-16-1961  Age: 63 y.o. MRN: [id number]  Chief Complaint  Patient presents with   Referral    Referral to knee surgery  Dr Donnella Gain in [facility] [phone]     Patient presents today with complaints of chronic knee arthritis and interest in referral for total knee replacement. He reports frequent swelling, stiffness, popping, and clicking. He does not take any anti-inflammatory medication. He reports previous x-rays showing bone on bone in the joint space. He has not seen ortho for this in the past.     Review of Systems  Constitutional:  Negative for chills, fever and weight loss.  Cardiovascular:  Negative for leg swelling.  Musculoskeletal:  Positive for joint pain. Negative for falls.  Neurological:  Negative for tingling.      Objective:     BP (!) 144/86   Pulse 84   Temp 98.2 F (36.8 C)   Ht 5\' 7"  (1.702 m)   Wt 195 lb (88.5 kg)   SpO2 96%   BMI 30.54 kg/m    Physical Exam Constitutional:      Appearance: Normal appearance. He is obese.  HENT:     Head: Normocephalic and atraumatic.  Cardiovascular:     Rate and Rhythm: Normal rate and regular rhythm.     Heart sounds: Normal heart sounds.  Pulmonary:     Effort: Pulmonary effort is normal.     Breath sounds: Normal breath sounds.  Musculoskeletal:     Left knee: Swelling (minimal) and crepitus present. No effusion or erythema. Decreased range of motion. Tenderness present.  Neurological:     Mental Status: He is alert.    No results found for any visits on 04/14/24.  The 10-year ASCVD risk score (Arnett DK, et al., 2019) is: 20.7%    Assessment & Plan:   Return if symptoms worsen or fail to improve.   Primary osteoarthritis of left knee Assessment & Plan: Patient presents today with chronic osteoarthritis of his left knee. Frequently popping and clicking, with moderate swelling and effusions in the  evenings. He denies significant pain but endorses stiffness. Referral placed to Dr. Donnella Gain per patient request. Meloxicam for swelling and inflammation. I advised knee brace and daily icing as well.   Orders: -     Ambulatory referral to Orthopedics -     Meloxicam; Take 1 tablet (15 mg total) by mouth daily.  Dispense: 30 tablet; Refill: 0    Amiera Herzberg, PA-C

## 2024-04-24 ENCOUNTER — Telehealth: Payer: Self-pay

## 2024-04-24 ENCOUNTER — Telehealth: Payer: Self-pay | Admitting: *Deleted

## 2024-04-24 DIAGNOSIS — M1712 Unilateral primary osteoarthritis, left knee: Secondary | ICD-10-CM | POA: Diagnosis not present

## 2024-04-24 NOTE — Telephone Encounter (Signed)
--   referral is pending approval -- Copied from CRM [id number]. Topic: Referral - Status >> Apr 24, 2024 10:54 AM Carlatta H wrote: Reason for CRM: Please text patient with an referral status update//

## 2024-04-24 NOTE — Telephone Encounter (Signed)
 Patient needs new orthopedic referral because previous orthopedics not covered by his insurance Has knee osteoarthritis We can utilize orthopedics in [place] or orthopedics here in [place], Dr. Phyllis Breeze [address]

## 2024-04-24 NOTE — Telephone Encounter (Signed)
 Copied from CRM [id number]. Topic: Referral - Status >> Apr 24, 2024  3:34 PM Loreda Rodriguez T wrote: Reason for CRM: Keta from [facility] called stated they do not take patient insurance. Patient needs another referral

## 2024-04-25 ENCOUNTER — Other Ambulatory Visit: Payer: Self-pay

## 2024-04-25 DIAGNOSIS — M179 Osteoarthritis of knee, unspecified: Secondary | ICD-10-CM

## 2024-05-02 ENCOUNTER — Telehealth: Payer: Self-pay | Admitting: *Deleted

## 2024-05-02 NOTE — Telephone Encounter (Signed)
 Grooms, [place], [place]     Referral is still pending. Nothing else I can do at this time.

## 2024-05-02 NOTE — Telephone Encounter (Unsigned)
 Copied from CRM [id number]. Topic: Referral - Status >> May 01, 2024  2:06 PM Lotus Round B wrote: Reason for CRM: pt called in to see if he can talk to Dr.Grooms about the referral status to get a call .

## 2024-05-03 NOTE — Telephone Encounter (Signed)
 Message sent to referral staff to check status

## 2024-05-11 ENCOUNTER — Other Ambulatory Visit: Payer: Self-pay | Admitting: Physician Assistant

## 2024-05-11 DIAGNOSIS — M1712 Unilateral primary osteoarthritis, left knee: Secondary | ICD-10-CM

## 2024-05-23 NOTE — Telephone Encounter (Signed)
  05/04/2024  5:41 PM [place], [employer] and spoke w/the patient, he declined to schedule, he stated he already sees a doctor in [place] for his knee.  Referral closed.

## 2024-11-21 DIAGNOSIS — H524 Presbyopia: Secondary | ICD-10-CM | POA: Diagnosis not present

## 2024-11-21 DIAGNOSIS — H3540 Unspecified peripheral retinal degeneration: Secondary | ICD-10-CM | POA: Diagnosis not present
